# Patient Record
Sex: Male | Born: 1975 | Race: White | Hispanic: No | Marital: Married | Smoking: Never smoker
Health system: Southern US, Community
[De-identification: ages and names within clinical notes are randomized; demographics above are authoritative.]

## PROBLEM LIST (undated history)

## (undated) DIAGNOSIS — G902 Horner's syndrome: Secondary | ICD-10-CM

## (undated) HISTORY — PX: NOSE SURGERY: SHX723

## (undated) HISTORY — PX: HAND SURGERY: SHX662

## (undated) HISTORY — PX: RESECTION DISTAL CLAVICAL: SHX5053

## (undated) HISTORY — PX: OTHER SURGICAL HISTORY: SHX169

---

## 2000-01-22 ENCOUNTER — Encounter: Payer: Self-pay | Admitting: Emergency Medicine

## 2000-01-22 ENCOUNTER — Emergency Department (HOSPITAL_COMMUNITY): Admission: EM | Admit: 2000-01-22 | Discharge: 2000-01-22 | Payer: Self-pay | Admitting: Emergency Medicine

## 2000-04-12 ENCOUNTER — Ambulatory Visit (HOSPITAL_BASED_OUTPATIENT_CLINIC_OR_DEPARTMENT_OTHER): Admission: RE | Admit: 2000-04-12 | Discharge: 2000-04-12 | Payer: Self-pay | Admitting: Oral Surgery

## 2000-04-25 ENCOUNTER — Encounter: Payer: Self-pay | Admitting: Specialist

## 2000-04-25 ENCOUNTER — Ambulatory Visit (HOSPITAL_COMMUNITY): Admission: RE | Admit: 2000-04-25 | Discharge: 2000-04-25 | Payer: Self-pay | Admitting: Specialist

## 2000-05-07 ENCOUNTER — Encounter: Payer: Self-pay | Admitting: Emergency Medicine

## 2000-05-07 ENCOUNTER — Emergency Department (HOSPITAL_COMMUNITY): Admission: EM | Admit: 2000-05-07 | Discharge: 2000-05-08 | Payer: Self-pay | Admitting: Emergency Medicine

## 2000-05-30 ENCOUNTER — Encounter: Payer: Self-pay | Admitting: Oral Surgery

## 2000-05-30 ENCOUNTER — Ambulatory Visit (HOSPITAL_COMMUNITY): Admission: RE | Admit: 2000-05-30 | Discharge: 2000-05-30 | Payer: Self-pay | Admitting: Oral Surgery

## 2000-06-09 ENCOUNTER — Ambulatory Visit (HOSPITAL_BASED_OUTPATIENT_CLINIC_OR_DEPARTMENT_OTHER): Admission: RE | Admit: 2000-06-09 | Discharge: 2000-06-09 | Payer: Self-pay | Admitting: Oral Surgery

## 2008-11-18 ENCOUNTER — Emergency Department (HOSPITAL_COMMUNITY): Admission: EM | Admit: 2008-11-18 | Discharge: 2008-11-18 | Payer: Self-pay | Admitting: Emergency Medicine

## 2009-10-27 ENCOUNTER — Encounter (INDEPENDENT_AMBULATORY_CARE_PROVIDER_SITE_OTHER): Payer: Self-pay | Admitting: *Deleted

## 2009-10-29 ENCOUNTER — Encounter: Admission: RE | Admit: 2009-10-29 | Discharge: 2009-10-29 | Payer: Self-pay | Admitting: Gastroenterology

## 2009-10-29 ENCOUNTER — Encounter (INDEPENDENT_AMBULATORY_CARE_PROVIDER_SITE_OTHER): Payer: Self-pay | Admitting: *Deleted

## 2010-08-04 NOTE — Letter (Signed)
Summary: New Patient letter  [facility]  [address] [place], [place] [address]   Phone: [phone]  Fax: [phone]       10/27/2009 MRN: [id number]  Hunter Ball [address] [place], [place]  [address]  Dear Hunter Ball,  Welcome to the Gastroenterology Division at [facility].    You are scheduled to see Dr. Christella Hartigan on 11-19-09 at 2:30p.m. on the 3rd floor at [facility], [address]  We ask that you try to arrive at our office 15 minutes prior to your appointment time to allow for check-in.  We would like you to complete the enclosed self-administered evaluation form prior to your visit and bring it with you on the day of your appointment.  We will review it with you.  Also, please bring a complete list of all your medications or, if you prefer, bring the medication bottles and we will list them.  Please bring your insurance card so that we may make a copy of it.  If your insurance requires a referral to see a specialist, please bring your referral form from your primary care physician.  Co-payments are due at the time of your visit and may be paid by cash, check or credit card.     Your office visit will consist of a consult with your physician (includes a physical exam), any laboratory testing he/she may order, scheduling of any necessary diagnostic testing (e.g. x-ray, ultrasound, CT-scan), and scheduling of a procedure (e.g. Endoscopy, Colonoscopy) if required.  Please allow enough time on your schedule to allow for any/all of these possibilities.    If you cannot keep your appointment, please call [phone] to cancel or reschedule prior to your appointment date.  This allows [place] the opportunity to schedule an appointment for another patient in need of care.  If you do not cancel or reschedule by 5 p.m. the business day prior to your appointment date, you will be charged a $50.00 late cancellation/no-show fee.    Thank you for  choosing [facility] for your medical needs.  We appreciate the opportunity to care for you.  Please visit [place] at our website  to learn more about our practice.                     Sincerely,                                                             The Gastroenterology Division

## 2010-11-20 NOTE — Op Note (Signed)
[place]  Patient:    Hunter Ball, Hunter Ball                     MRN: [id number] Proc. Date: 04/12/00 Adm. Date:  [id number] Disc. 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  PROCEDURE:  Surgical rapid palatal expansion.  ANESTHESIA:  General.  SURGEON:  Hinton Dyer, D.D.S.  ESTIMATED BLOOD LOSS:  Approximately 200 cc.  CONDITION AT END OF SURGERY:  Good.  PROCEDURE:  Following preoperative medication, the patient was brought to the operating room and placed in the supine position, in which he remained throughout the whole procedure.  He was intubated by right nasal endotracheal tube and then prepped and draped in the usual fashion for an intraoral procedure.  He was then turned 90% to the anesthesia cart.  After draping the patient off, the mouth was irrigated and suctioned out, and a moist, open 4x4 gauze was placed around the endotracheal tube.  Xylocaine 2% with 1:100,000 epinephrine 4.5 cc was infiltrated in the mucobuccal fold on the right and left side, extending from the pterygoid plate region to the midline region.  Attention was then turned to the left side while the bite block was placed on the right side.  A #15 blade was used to make an incision at the height of the mucobuccal fold in the area of the first molar to the canine on the same side - that would be tooth #11.  An elevator and a Hannahan were used to reflect the soft tissues up approximately 1.5 cm.  The dissection was carried back to the pterygoid plate region, and carried forward to the anterior nasal aperture.  A Freer elevator was used to reflect the soft tissues of the nasal aperture off the bony wall. With a ______ periosteal in  place, a reciprocating saw was then used to make a cut from the pterygoid plate to the nasal aperture with a ______ periosteal protecting the soft tissues.  Attention was paid to stay 2-3 mm above the apices of the teeth.  The area was irrigated out and then closed primarily with multiple 4-0 Vicryl sutures.  Prior to doing this, the pterygoid plate osteotome was inserted against the pterygoid plate and tapped into position with the finger intraorally, to make sure that the pterygoid plate was free.  Attention was then turned to the right side, the bite block was switched, and a #15 blade was used to make an incision from the first molar to the right canine area - tooth #6, and a full thickness mucobuccal periosteal flap was then elevated.  Again, the dissection was carried back to the pterygoid plate area and to the lateral nasal aperture.  Again, the [facility] was used to reflect the soft tissues off the floor of the nose.  With a ______ periosteal in place, a reciprocating saw was used to make a cut from the pterygoid plate area to the nasal aperture.  The pterygoid plate osteotome was tapped into position on the right side, filling the leading edge and intraorally.  Once this was done, the area was irrigated out, and again, the cut was made 2-3 mm above the apices  of the maxillary teeth.  Attention was then turned to the soft tissues in the anterior region, and with a vertical incision in place down to bone, the periosteal elevator reflected the soft tissues laterally, and reflecting the soft tissues off the suture, and then a 7 mm crosscut fissure brewer was used to make a cut down the midline, staying between the teeth.  A spatula osteotome was then tapped through with finger pressure to make sure that it did not perforate the soft tissues of the palate, and the osteotome was tapped all the way down to the hub of the osteotome.  It was then removed, and then tapped through  the alveolus in a vertical direction.  Again, care was taken not to perforate the soft tissues.  Once done, the key was used to activate the appliance, and it was turned four times.  Once it was activated, mobility of both segments was visualized with the ______ forming in the anterior region.  The Hyrax appliance was then backed down the four turns and the centrals came back to touching again. The cuts were then irrigated out, and closed primarily with 4-0 Vicryl sutures in horizontal mattress fashion.  The throat pack was removed.  The patients mouth was irrigated and suctioned dry, and he was returned to the recovery room in good condition.  He will be discharged home with a prescription for antibiotic, Keflex 500 mg x 28 two STAT and one q.i.d., and Vicodin x 20 one or two q.4h. p.r.n. pain. He was also given a bottle of Peridex preoperatively. DD:  04/12/00 TD:  04/12/00 Job: [id number]
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
## 2012-10-04 ENCOUNTER — Ambulatory Visit (INDEPENDENT_AMBULATORY_CARE_PROVIDER_SITE_OTHER): Payer: BC Managed Care – PPO | Admitting: Internal Medicine

## 2012-10-04 ENCOUNTER — Encounter: Payer: Self-pay | Admitting: Internal Medicine

## 2012-10-04 VITALS — BP 112/64 | HR 81 | Temp 97.9°F | Ht 79.0 in | Wt 205.0 lb

## 2012-10-04 DIAGNOSIS — Z0000 Encounter for general adult medical examination without abnormal findings: Secondary | ICD-10-CM

## 2012-10-04 NOTE — Assessment & Plan Note (Signed)
Td < 4 years ago per pt Never had a cscope EKG today, nsr, incomplete RBBB? Pt is asx, no previous EKG Labs Discussed diet (info provided) and exercise

## 2012-10-04 NOTE — Patient Instructions (Signed)
Please come back at your earliest convenience for labs: CMP,CBC, TSH, FLP-- dx v70 ---- exercise 3 hours a week

## 2012-10-04 NOTE — Progress Notes (Signed)
  Subjective:    Patient ID: Hunter Ball, male    DOB: May 28, 1976, 37 y.o.   MRN: [id number]  HPI New pt, CPX  No past medical history on file.  Past Surgical History  Procedure Laterality Date  . No past surgeries     Family History  Problem Relation Age of Onset  . Colon cancer Neg Hx   . Prostate cancer Neg Hx   . CAD Neg Hx   . Diabetes Neg Hx   . Valvular heart disease Father   . Hypertension Neg Hx    History   Social History  . Marital Status: Married    Spouse Name: N/A    Number of Children: 1  . Years of Education: N/A   Occupational History  . 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 Smoking status: Never Smoker   . Smokeless tobacco: Never Used  . Alcohol Use: Yes     Comment: rarely   . Drug Use: Not on file     Comment: rarely marihuana   . Sexually Active: Not on file   Other Topics Concern  . Not on file   Social History Narrative   Diet: healthy for the last 2-3 years   Exercise: has a physical job, no regular exercise            Review of Systems Doing well. No recent chest pain, shortness of breath, cough or wheezing. No nausea, vomiting, diarrhea or blood in the stools. No dysuria or gross hematuria.     Objective:   Physical Exam General -- alert, well-developed, NAD .   Neck --no thyromegaly Lungs -- normal respiratory effort, no intercostal retractions, no accessory muscle use, and normal breath sounds.   Heart-- normal rate, regular rhythm, no murmur, and no gallop.   Abdomen--soft, non-tender, no distention, no masses, no HSM, no guarding, and no rigidity.   Extremities-- no pretibial edema bilaterally  Neurologic-- alert & oriented X3 and strength normal in all extremities. Psych-- Cognition and judgment appear intact. Alert and cooperative with normal attention span and concentration.  not anxious appearing and not depressed appearing.        Assessment & Plan:

## 2012-10-05 ENCOUNTER — Encounter: Payer: Self-pay | Admitting: Internal Medicine

## 2012-10-12 ENCOUNTER — Other Ambulatory Visit (INDEPENDENT_AMBULATORY_CARE_PROVIDER_SITE_OTHER): Payer: BC Managed Care – PPO

## 2012-10-12 DIAGNOSIS — Z0000 Encounter for general adult medical examination without abnormal findings: Secondary | ICD-10-CM

## 2012-10-12 LAB — COMPREHENSIVE METABOLIC PANEL
ALT: 13 U/L (ref 0–53)
AST: 15 U/L (ref 0–37)
Albumin: 4.5 g/dL (ref 3.5–5.2)
Alkaline Phosphatase: 54 U/L (ref 39–117)
BUN: 16 mg/dL (ref 6–23)
CO2: 28 mEq/L (ref 19–32)
Calcium: 9.6 mg/dL (ref 8.4–10.5)
Chloride: 101 mEq/L (ref 96–112)
Creatinine, Ser: 1.2 mg/dL (ref 0.4–1.5)
GFR: 72.21 mL/min (ref >60.00)
Glucose, Bld: 95 mg/dL (ref 70–99)
Potassium: 4 mEq/L (ref 3.5–5.1)
Sodium: 137 mEq/L (ref 135–145)
Total Bilirubin: 1.4 mg/dL — ABNORMAL HIGH (ref 0.3–1.2)
Total Protein: 8 g/dL (ref 6.0–8.3)

## 2012-10-12 LAB — CBC WITH DIFFERENTIAL/PLATELET
Basophils Absolute: 0 10*3/uL (ref 0.0–0.1)
Basophils Relative: 0.2 % (ref 0.0–3.0)
Eosinophils Absolute: 0.2 10*3/uL (ref 0.0–0.7)
Eosinophils Relative: 2.7 % (ref 0.0–5.0)
HCT: 43.7 % (ref 39.0–52.0)
Hemoglobin: 14.9 g/dL (ref 13.0–17.0)
Lymphocytes Relative: 14.4 % (ref 12.0–46.0)
Lymphs Abs: 1.2 10*3/uL (ref 0.7–4.0)
MCHC: 34 g/dL (ref 30.0–36.0)
MCV: 89.6 fl (ref 78.0–100.0)
Monocytes Absolute: 0.7 10*3/uL (ref 0.1–1.0)
Monocytes Relative: 8.1 % (ref 3.0–12.0)
Neutro Abs: 6.4 10*3/uL (ref 1.4–7.7)
Neutrophils Relative %: 74.6 % (ref 43.0–77.0)
Platelets: 261 10*3/uL (ref 150.0–400.0)
RBC: 4.88 Mil/uL (ref 4.22–5.81)
RDW: 12.9 % (ref 11.5–14.6)
WBC: 8.6 10*3/uL (ref 4.5–10.5)

## 2012-10-12 LAB — LIPID PANEL
Cholesterol: 156 mg/dL (ref 0–200)
HDL: 41.1 mg/dL (ref >39.00)
LDL Cholesterol: 104 mg/dL — ABNORMAL HIGH (ref 0–99)
Total CHOL/HDL Ratio: 4
Triglycerides: 54 mg/dL (ref 0.0–149.0)
VLDL: 10.8 mg/dL (ref 0.0–40.0)

## 2012-10-12 LAB — TSH: TSH: 1.05 u[IU]/mL (ref 0.35–5.50)

## 2012-10-16 ENCOUNTER — Encounter: Payer: Self-pay | Admitting: *Deleted

## 2012-12-28 ENCOUNTER — Ambulatory Visit (INDEPENDENT_AMBULATORY_CARE_PROVIDER_SITE_OTHER): Payer: BC Managed Care – PPO | Admitting: Family Medicine

## 2012-12-28 ENCOUNTER — Encounter: Payer: Self-pay | Admitting: Family Medicine

## 2012-12-28 VITALS — BP 110/62 | HR 62 | Temp 98.4°F | Wt 205.8 lb

## 2012-12-28 DIAGNOSIS — L247 Irritant contact dermatitis due to plants, except food: Secondary | ICD-10-CM

## 2012-12-28 DIAGNOSIS — L255 Unspecified contact dermatitis due to plants, except food: Secondary | ICD-10-CM

## 2012-12-28 MED ORDER — PREDNISONE 10 MG PO TABS: ORAL_TABLET | ORAL | Status: DC

## 2012-12-28 MED ORDER — MOMETASONE FUROATE 0.1 % EX CREA: TOPICAL_CREAM | Freq: Every day | CUTANEOUS | Status: DC

## 2012-12-28 NOTE — Patient Instructions (Signed)
Poison Ivy Poison ivy is a inflammation of the skin (contact dermatitis) caused by touching the allergens on the leaves of the ivy plant following previous exposure to the plant. The rash usually appears 48 hours after exposure. The rash is usually bumps (papules) or blisters (vesicles) in a linear pattern. Depending on your own sensitivity, the rash may simply cause redness and itching, or it may also progress to blisters which may break open. These must be well cared for to prevent secondary bacterial (germ) infection, followed by scarring. Keep any open areas dry, clean, dressed, and covered with an antibacterial ointment if needed. The eyes may also get puffy. The puffiness is worst in the morning and gets better as the day progresses. This dermatitis usually heals without scarring, within 2 to 3 weeks without treatment. HOME CARE INSTRUCTIONS  Thoroughly wash with soap and water as soon as you have been exposed to poison ivy. You have about one half hour to remove the plant resin before it will cause the rash. This washing will destroy the oil or antigen on the skin that is causing, or will cause, the rash. Be sure to wash under your fingernails as any plant resin there will continue to spread the rash. Do not rub skin vigorously when washing affected area. Poison ivy cannot spread if no oil from the plant remains on your body. A rash that has progressed to weeping sores will not spread the rash unless you have not washed thoroughly. It is also important to wash any clothes you have been wearing as these may carry active allergens. The rash will return if you wear the unwashed clothing, even several days later. Avoidance of the plant in the future is the best measure. Poison ivy plant can be recognized by the number of leaves. Generally, poison ivy has three leaves with flowering branches on a single stem. Diphenhydramine may be purchased over the counter and used as needed for itching. Do not drive with  this medication if it makes you drowsy.Ask your caregiver about medication for children. SEEK MEDICAL CARE IF:  Open sores develop.  Redness spreads beyond area of rash.  You notice purulent (pus-like) discharge.  You have increased pain.  Other signs of infection develop (such as fever). Document Released: 06/18/2000 Document Revised: 09/13/2011 Document Reviewed: 05/07/2009 ExitCare Patient Information 2014 [employer].  

## 2012-12-30 ENCOUNTER — Encounter: Payer: Self-pay | Admitting: Family Medicine

## 2012-12-30 NOTE — Progress Notes (Signed)
  Subjective:     Hunter Ball is a 37 y.o. male who presents for evaluation of a rash involving the lower extremity and upper extremity. Rash started several days ago. Lesions are pink, and blistering in texture. Rash has changed over time. Rash is pruritic.--but it is improving.   Associated symptoms: none. Patient denies: abdominal pain, arthralgia, congestion, cough, crankiness, decrease in appetite, decrease in energy level, fever, headache, irritability, myalgia, nausea, sore throat and vomiting. Patient has not had contacts with similar rash. Patient has had new exposures (soaps, lotions, laundry detergents, foods, medications, plants, insects or animals).  The following portions of the patient's history were reviewed and updated as appropriate: allergies, current medications, past family history, past medical history, past social history, past surgical history and problem list.  Review of Systems Pertinent items are noted in HPI.    Objective:    BP 110/62  Pulse 62  Temp(Src) 98.4 F (36.9 C) (Oral)  Wt 205 lb 12.8 oz (93.35 kg)  BMI 23.17 kg/m2  SpO2 98% General:  alert, cooperative, appears stated age and no distress  Skin:  vesicles noted on extremities     Assessment:    contact dermatitis: plants poison ivy    Plan:    Medications: steroids: pred taper and topical steroid: see meds and orders. verbal and written patient instruction given. Follow up in several days.

## 2013-04-18 ENCOUNTER — Ambulatory Visit: Payer: BC Managed Care – PPO | Admitting: Internal Medicine

## 2013-04-19 ENCOUNTER — Encounter: Payer: Self-pay | Admitting: Family Medicine

## 2013-04-19 ENCOUNTER — Ambulatory Visit (INDEPENDENT_AMBULATORY_CARE_PROVIDER_SITE_OTHER): Payer: Worker's Compensation | Admitting: Family Medicine

## 2013-04-19 VITALS — BP 104/62 | HR 80 | Temp 98.1°F | Wt 199.8 lb

## 2013-04-19 DIAGNOSIS — M25519 Pain in unspecified shoulder: Secondary | ICD-10-CM

## 2013-04-19 DIAGNOSIS — M25511 Pain in right shoulder: Secondary | ICD-10-CM

## 2013-04-19 NOTE — Progress Notes (Signed)
  Subjective:    Hunter Ball is a 37 y.o. male who presents with right shoulder pain. The symptoms began several months ago. 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 Pain is located between the neck and shoulder. Discomfort is described as numbness, sharp/stabbing, tingling and popping. Symptoms are exacerbated by repetitive movements and overhead movements. Evaluation to date: none. Therapy to date includes: nothing specific.  The following portions of the patient's history were reviewed and updated as appropriate: allergies, current medications, past family history, past medical history, past social history, past surgical history and problem list.  Review of Systems Pertinent items are noted in HPI.   Objective:    BP 104/62  Pulse 80  Temp(Src) 98.1 F (36.7 C) (Oral)  Wt 199 lb 12.8 oz (90.629 kg)  BMI 22.5 kg/m2  SpO2 98% Right shoulder: full ROM and crepitus with ROM  Left shoulder: normal active ROM, no tenderness, no impingement sign     Assessment:    Right shoulder pain    Plan:    Rest, ice, compression, and elevation (RICE) therapy. Orthopedics referral.

## 2013-04-19 NOTE — Patient Instructions (Signed)
Shoulder Pain  The shoulder is the joint that connects your arms to your body. The bones that form the shoulder joint include the upper arm bone (humerus), the shoulder blade (scapula), and the collarbone (clavicle). The top of the humerus is shaped like a ball and fits into a rather flat socket on the scapula (glenoid cavity). A combination of muscles and strong, fibrous tissues that connect muscles to bones (tendons) support your shoulder joint and hold the ball in the socket. Small, fluid-filled sacs (bursae) are located in different areas of the joint. They act as cushions between the bones and the overlying soft tissues and help reduce friction between the gliding tendons and the bone as you move your arm. Your shoulder joint allows a wide range of motion in your arm. This range of motion allows you to do things like scratch your back or throw a ball. However, this range of motion also makes your shoulder more prone to pain from overuse and injury.  Causes of shoulder pain can originate from both injury and overuse and usually can be grouped in the following four categories:   Redness, swelling, and pain (inflammation) of the tendon (tendinitis) or the bursae (bursitis).   Instability, such as a dislocation of the joint.   Inflammation of the joint (arthritis).   Broken bone (fracture).  HOME CARE INSTRUCTIONS    Apply ice to the sore area.   Put ice in a plastic bag.   Place a towel between your skin and the bag.   Leave the ice on for 15-20 minutes, 3-4 times per day for the first 2 days.   Stop using cold packs if they do not help with the pain.   If you have a shoulder sling or immobilizer, wear it as long as your caregiver instructs. Only remove it to shower or bathe. Move your arm as little as possible, but keep your hand moving to prevent swelling.   Squeeze a soft ball or foam pad as much as possible to help prevent swelling.   Only take over-the-counter or prescription medicines for pain,  discomfort, or fever as directed by your caregiver.  SEEK MEDICAL CARE IF:    Your shoulder pain increases, or new pain develops in your arm, hand, or fingers.   Your hand or fingers become cold and numb.   Your pain is not relieved with medicines.  SEEK IMMEDIATE MEDICAL CARE IF:    Your arm, hand, or fingers are numb or tingling.   Your arm, hand, or fingers are significantly swollen or turn white or blue.  MAKE SURE YOU:    Understand these instructions.   Will watch your condition.   Will get help right away if you are not doing well or get worse.  Document Released: 03/31/2005 Document Revised: 03/15/2012 Document Reviewed: 06/05/2011  ExitCare Patient Information 2014 [employer].

## 2013-07-28 ENCOUNTER — Encounter (HOSPITAL_COMMUNITY): Payer: Self-pay | Admitting: Emergency Medicine

## 2013-07-28 ENCOUNTER — Emergency Department (INDEPENDENT_AMBULATORY_CARE_PROVIDER_SITE_OTHER)
Admission: EM | Admit: 2013-07-28 | Discharge: 2013-07-28 | Disposition: A | Discharge status: Home / Self Care | Payer: BC Managed Care – PPO | Source: Home / Self Care | Attending: Emergency Medicine | Admitting: Emergency Medicine

## 2013-07-28 DIAGNOSIS — S99929A Unspecified injury of unspecified foot, initial encounter: Secondary | ICD-10-CM

## 2013-07-28 DIAGNOSIS — S99919A Unspecified injury of unspecified ankle, initial encounter: Secondary | ICD-10-CM

## 2013-07-28 DIAGNOSIS — S8010XA Contusion of unspecified lower leg, initial encounter: Secondary | ICD-10-CM

## 2013-07-28 DIAGNOSIS — S8990XA Unspecified injury of unspecified lower leg, initial encounter: Secondary | ICD-10-CM
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
## 2013-07-28 NOTE — ED Provider Notes (Signed)
CSN: [id number]     Arrival date & time 07/28/13  1440 History   First MD Initiated Contact with Patient 07/28/13 1511     Chief Complaint  Patient presents with  . Leg Injury   (Consider location/radiation/quality/duration/timing/severity/associated sxs/prior Treatment) HPI Comments: 38 year old male with no significant past medical history presents for evaluation of a leg injury sustained one week ago. He was walking up hill when he slipped and hit his shin on to something. He had a large hematoma formed over the shin within one hour which resolved over the next 4 days. However, this stopped getting better 4 days ago and has been consistent swelling on the anterior shin. There is also some mild to moderate tenderness over the anterior shin around the place where he hit. The pain is not getting worse, it just does not continue to resolve like it did at first. No history of DVT or PE. No swelling or numbness in his foot distal to this   History reviewed. No pertinent past medical history. Past Surgical History  Procedure Laterality Date  . Orthopedic surgeries     Family History  Problem Relation Age of Onset  . Colon cancer Neg Hx   . Prostate cancer Neg Hx   . CAD Neg Hx   . Diabetes Neg Hx   . Valvular heart disease Father   . Hypertension Neg Hx    History  Substance Use Topics  . Smoking status: Never Smoker   . Smokeless tobacco: Never Used  . Alcohol Use: Yes     Comment: occasional    Review of Systems  Constitutional: Negative for fever, chills and fatigue.  HENT: Negative for sore throat.   Eyes: Negative for visual disturbance.  Respiratory: Negative for cough and shortness of breath.   Cardiovascular: Negative for chest pain, palpitations and leg swelling.  Gastrointestinal: Negative for nausea, vomiting, abdominal pain, diarrhea and constipation.  Genitourinary: Negative for dysuria, urgency, frequency and hematuria.  Musculoskeletal:       See HPI  Skin:  Negative for rash.  Neurological: Negative for dizziness, weakness and light-headedness.    Allergies  Review of patient's allergies indicates no known allergies.  Home Medications  No current outpatient prescriptions on file. BP 114/68  Pulse 84  Temp(Src) 97.5 F (36.4 C) (Oral)  Resp 18  SpO2 100% Physical Exam  Nursing note and vitals reviewed. Constitutional: He is oriented to person, place, and time. He appears well-developed and well-nourished. No distress.  HENT:  Head: Normocephalic.  Pulmonary/Chest: Effort normal. No respiratory distress.  Musculoskeletal:       Legs: Neurological: He is alert and oriented to person, place, and time. Coordination normal.  Skin: Skin is warm and dry. No rash noted. He is not diaphoretic.  Psychiatric: He has a normal mood and affect. Judgment normal.    ED Course  Procedures (including critical care time) Labs Review Labs Reviewed - No data to display Imaging Review No results found.    MDM   1. Leg injury   2. Traumatic hematoma of lower leg    I believe this patient has a hematoma that has not yet fully resolved. There are no Signs of infection or DVT at this time. We have discussed strict return precautions in case of developing signs or symptoms of DVT or infection.    Graylon GoodZachary H Malaney Mcbean, PA-C 07/28/13 [id number]

## 2013-07-28 NOTE — ED Provider Notes (Signed)
Medical screening examination/treatment/procedure(s) were performed by non-physician practitioner and as supervising physician I was immediately available for consultation/collaboration.  Leslee Homeavid Katheryne Gorr, M.D.   Reuben Likesavid C Leonilda Cozby, MD 07/28/13 2135

## 2013-07-28 NOTE — Discharge Instructions (Signed)
Hematoma A hematoma is a collection of blood under the skin, in an organ, in a body space, in a joint space, or in other tissue. The blood can clot to form a lump that you can see and feel. The lump is often firm and may sometimes become sore and tender. Most hematomas get better in a few days to weeks. However, some hematomas may be serious and require medical care. Hematomas can range in size from very small to very large. CAUSES  A hematoma can be caused by a blunt or penetrating injury. It can also be caused by spontaneous leakage from a blood vessel under the skin. Spontaneous leakage from a blood vessel is more likely to occur in older people, especially those taking blood thinners. Sometimes, a hematoma can develop after certain medical procedures. SIGNS AND SYMPTOMS   A firm lump on the body.  Possible pain and tenderness in the area.  Bruising.Blue, dark blue, purple-red, or yellowish skin may appear at the site of the hematoma if the hematoma is close to the surface of the skin. For hematomas in deeper tissues or body spaces, the signs and symptoms may be subtle. For example, an intra-abdominal hematoma may cause abdominal pain, weakness, fainting, and shortness of breath. An intracranial hematoma may cause a headache or symptoms such as weakness, trouble speaking, or a change in consciousness. DIAGNOSIS  A hematoma can usually be diagnosed based on your medical history and a physical exam. Imaging tests may be needed if your health care provider suspects a hematoma in deeper tissues or body spaces, such as the abdomen, head, or chest. These tests may include ultrasonography or a CT scan.  TREATMENT  Hematomas usually go away on their own over time. Rarely does the blood need to be drained out of the body. Large hematomas or those that may affect vital organs will sometimes need surgical drainage or monitoring. HOME CARE INSTRUCTIONS   Apply ice to the injured area:   Put ice in a  plastic bag.   Place a towel between your skin and the bag.   Leave the ice on for 20 minutes, 2 3 times a day for the first 1 to 2 days.   After the first 2 days, switch to using warm compresses on the hematoma.   Elevate the injured area to help decrease pain and swelling. Wrapping the area with an elastic bandage may also be helpful. Compression helps to reduce swelling and promotes shrinking of the hematoma. Make sure the bandage is not wrapped too tight.   If your hematoma is on a lower extremity and is painful, crutches may be helpful for a couple days.   Only take over-the-counter or prescription medicines as directed by your health care provider. SEEK IMMEDIATE MEDICAL CARE IF:   You have increasing pain, or your pain is not controlled with medicine.   You have a fever.   You have worsening swelling or discoloration.   Your skin over the hematoma breaks or starts bleeding.   Your hematoma is in your chest or abdomen and you have weakness, shortness of breath, or a change in consciousness.  Your hematoma is on your scalp (caused by a fall or injury) and you have a worsening headache or a change in alertness or consciousness. MAKE SURE YOU:   Understand these instructions.  Will watch your condition.  Will get help right away if you are not doing well or get worse. Document Released: 02/03/2004 Document Revised: 02/21/2013 Document Reviewed:   11/29/2012 Exit[employer] Patient Information 2014 [employer].  

## 2013-08-01 ENCOUNTER — Encounter: Payer: Self-pay | Admitting: Family Medicine

## 2013-08-01 ENCOUNTER — Ambulatory Visit (HOSPITAL_BASED_OUTPATIENT_CLINIC_OR_DEPARTMENT_OTHER)
Admission: RE | Admit: 2013-08-01 | Discharge: 2013-08-01 | Disposition: A | Discharge status: Home / Self Care | Payer: BC Managed Care – PPO | Source: Ambulatory Visit | Attending: Family Medicine | Admitting: Family Medicine

## 2013-08-01 ENCOUNTER — Ambulatory Visit (INDEPENDENT_AMBULATORY_CARE_PROVIDER_SITE_OTHER): Payer: BC Managed Care – PPO | Admitting: Family Medicine

## 2013-08-01 VITALS — BP 110/74 | HR 71 | Temp 98.2°F | Resp 16 | Wt 218.0 lb

## 2013-08-01 DIAGNOSIS — S99919A Unspecified injury of unspecified ankle, initial encounter: Secondary | ICD-10-CM

## 2013-08-01 DIAGNOSIS — S8990XA Unspecified injury of unspecified lower leg, initial encounter: Secondary | ICD-10-CM

## 2013-08-01 DIAGNOSIS — X58XXXA Exposure to other specified factors, initial encounter: Secondary | ICD-10-CM | POA: Insufficient documentation

## 2013-08-01 DIAGNOSIS — M7989 Other specified soft tissue disorders: Secondary | ICD-10-CM | POA: Insufficient documentation

## 2013-08-01 DIAGNOSIS — M79609 Pain in unspecified limb: Secondary | ICD-10-CM | POA: Insufficient documentation

## 2013-08-01 DIAGNOSIS — L02419 Cutaneous abscess of limb, unspecified: Secondary | ICD-10-CM

## 2013-08-01 DIAGNOSIS — S99929A Unspecified injury of unspecified foot, initial encounter: Secondary | ICD-10-CM

## 2013-08-01 DIAGNOSIS — L03119 Cellulitis of unspecified part of limb: Secondary | ICD-10-CM | POA: Insufficient documentation

## 2013-08-01 MED ORDER — CEPHALEXIN 500 MG PO CAPS: 500.0000 mg | ORAL_CAPSULE | Freq: Two times a day (BID) | ORAL | Status: AC

## 2013-08-01 MED ORDER — TRAMADOL HCL 50 MG PO TABS: 50.0000 mg | ORAL_TABLET | Freq: Three times a day (TID) | ORAL | Status: DC | PRN

## 2013-08-01 NOTE — Assessment & Plan Note (Signed)
New.  Get xray to assess for possible fx.  Ibuprofen for pain/swelling.  Start tramadol for night pain.  Reviewed supportive care and red flags that should prompt return.  Pt expressed understanding and is in agreement w/ plan.

## 2013-08-01 NOTE — Assessment & Plan Note (Signed)
New.  Start abx given surrounding erythema and central scab.  Reviewed supportive care and red flags that should prompt return.  Pt expressed understanding and is in agreement w/ plan.

## 2013-08-01 NOTE — Progress Notes (Signed)
Pre visit review using our clinic review tool, if applicable. No additional management support is needed unless otherwise documented below in the visit note. 

## 2013-08-01 NOTE — Progress Notes (Signed)
   Subjective:    Patient ID: Hunter Ball, male    DOB: Sep 25, 1975, 38 y.o.   MRN: [id number]  HPI Leg pain- R anterior lower leg, fell on an incline and hit shin.  4-5 hrs later had 'softball sized' lump at site of injury.  Went to [facility] on Saturday b/c it remained golf ball sized, and was told it was a hematoma.  Most painful during 1 hr commute and using leg to step on gas.  Now having pain at night.      Review of Systems For ROS see HPI     Objective:   Physical Exam  Vitals reviewed. Constitutional: He appears well-developed and well-nourished. No distress.  Cardiovascular: Intact distal pulses.   Musculoskeletal:  R upper tibia w/ hematoma like swelling w/ central scab and surrounding erythema.  + TTP  Neurological: Coordination normal.  Skin: Skin is warm and dry. There is erythema.  Psychiatric: He has a normal mood and affect. His behavior is normal.          Assessment & Plan:

## 2013-08-01 NOTE — Patient Instructions (Signed)
Go to [facility] [address] and get your xrays Alternate tylenol and ibuprofen every 4 hrs for muscle inflammation and pain Use the Tramadol at night for pain Take the keflex twice daily x7 days for cellulitis (infection) Call with any questions or concerns Hang in there!!!

## 2014-07-08 ENCOUNTER — Encounter: Payer: Self-pay | Admitting: Medical

## 2014-07-08 ENCOUNTER — Ambulatory Visit (INDEPENDENT_AMBULATORY_CARE_PROVIDER_SITE_OTHER): Payer: Self-pay | Admitting: Medical

## 2014-07-08 VITALS — BP 119/72 | HR 82 | Temp 97.6°F | Ht 79.5 in | Wt 206.8 lb

## 2014-07-08 DIAGNOSIS — R1032 Left lower quadrant pain: Secondary | ICD-10-CM

## 2014-07-08 DIAGNOSIS — R197 Diarrhea, unspecified: Secondary | ICD-10-CM

## 2014-07-08 LAB — COMPLETE METABOLIC PANEL WITH GFR
ALT: 12 U/L (ref 0–53)
AST: 14 U/L (ref 0–37)
Albumin: 4.6 g/dL (ref 3.5–5.2)
Alkaline Phosphatase: 51 U/L (ref 39–117)
BUN: 15 mg/dL (ref 6–23)
CO2: 30 mEq/L (ref 19–32)
Calcium: 9.5 mg/dL (ref 8.4–10.5)
Chloride: 102 mEq/L (ref 96–112)
Creat: 1.02 mg/dL (ref 0.50–1.35)
GFR, Est African American: >89 mL/min
GFR, Est Non African American: >89 mL/min
Glucose, Bld: 77 mg/dL (ref 70–99)
Potassium: 4.4 mEq/L (ref 3.5–5.3)
Sodium: 138 mEq/L (ref 135–145)
Total Bilirubin: 1 mg/dL (ref 0.2–1.2)
Total Protein: 7.1 g/dL (ref 6.0–8.3)

## 2014-07-08 LAB — CBC WITH DIFFERENTIAL/PLATELET
Basophils Absolute: 0 10*3/uL (ref 0.0–0.1)
Basophils Relative: 0.5 % (ref 0.0–3.0)
Eosinophils Absolute: 0.3 10*3/uL (ref 0.0–0.7)
Eosinophils Relative: 4.6 % (ref 0.0–5.0)
HCT: 40.5 % (ref 39.0–52.0)
Hemoglobin: 13.6 g/dL (ref 13.0–17.0)
Lymphocytes Relative: 20.6 % (ref 12.0–46.0)
Lymphs Abs: 1.3 10*3/uL (ref 0.7–4.0)
MCHC: 33.7 g/dL (ref 30.0–36.0)
MCV: 89.5 fl (ref 78.0–100.0)
Monocytes Absolute: 0.8 10*3/uL (ref 0.1–1.0)
Monocytes Relative: 11.7 % (ref 3.0–12.0)
Neutro Abs: 4.1 10*3/uL (ref 1.4–7.7)
Neutrophils Relative %: 62.6 % (ref 43.0–77.0)
Platelets: 275 10*3/uL (ref 150.0–400.0)
RBC: 4.52 Mil/uL (ref 4.22–5.81)
RDW: 12.8 % (ref 11.5–15.5)
WBC: 6.5 10*3/uL (ref 4.0–10.5)

## 2014-07-08 MED ORDER — CIPROFLOXACIN HCL 500 MG PO TABS: 500.0000 mg | ORAL_TABLET | Freq: Two times a day (BID) | ORAL | Status: DC

## 2014-07-08 MED ORDER — METRONIDAZOLE 500 MG PO TABS: 500.0000 mg | ORAL_TABLET | Freq: Three times a day (TID) | ORAL | Status: DC

## 2014-07-08 NOTE — Assessment & Plan Note (Signed)
You have likely acute  viral gastroenteritis but possible(by your hx other chronic conditions). I want you to rest, hydrate, follow bland diet guidlines and take tylenol for fever.  Take imodium otc for loose stools.  There is some chance that your have a bacterial infection so I do want you to get stool panel kit and turn that in as soon as possible. Turning stool panel kit earlier will provide us with quicker result of studies and more informed decision if antibiotics are needed.  If you get increased left lower quadrant pain pending studies then you can start flagyl and cipro antibiotics. But would discourage that.  If you get any direct rt lower quadrant pain then would recommend going ahead and getting imaging studies.  Follow up 10-14 days or as needed.   

## 2014-07-08 NOTE — Progress Notes (Signed)
Pre visit review using our clinic review tool, if applicable. No additional management support is needed unless otherwise documented below in the visit note. 

## 2014-07-08 NOTE — Progress Notes (Signed)
   Subjective:    Patient ID: Hunter Ball, male    DOB: 10-Jan-1976, 39 y.o.   MRN: [id number]  HPI   Pt states abdominal pain. He states pain is his lower abdomen. Pt states on both sides. He feels some bloated sensation. He does not pass a lot of gas. This has been going on couple of weeks. Paint on both sides and about the same but mild and transient will last seconds at times.. Pt has occasional loose stools in the past recently passes loose stools with small chunks past 2 weeks. Last few weeks random urge to have bm. Most recenly has been going 4-5 times a day. Stomach rumbling. No family members sick past 2 weeks. NO fevers, no chils, no nauseu, no vomiting. No recent suspicious preceding foods before last 2 weeks. He can eat. No history of ibs, No hx of chrones or ulcerative colitis.   Pt does have remote hx of traveling to [place] and half at the longest. But would eat foods that he questions sanitary conditions. But around time he did not have any acute illness.   Review of Systems  Constitutional: Negative for fever, chills and fatigue.  Respiratory: Negative for cough, choking and wheezing.   Cardiovascular: Negative for chest pain and palpitations.  Gastrointestinal: Positive for abdominal pain and diarrhea. Negative for nausea, vomiting, constipation, blood in stool, abdominal distention, anal bleeding and rectal pain.  Genitourinary: Negative.   Musculoskeletal: Negative for back pain.  Skin: Negative for rash.  Neurological: Negative for dizziness, syncope, speech difficulty, weakness, light-headedness and numbness.  Hematological: Negative for adenopathy.  Psychiatric/Behavioral: Negative for confusion and sleep disturbance.       Objective:   Physical Exam   General Appearance- Not in acute distress.  HEENT Eyes- Scleraeral/Conjuntiva-bilat- Not Yellow. Mouth & Throat- Normal.  Chest and Lung Exam Auscultation: Breath  sounds:-Normal. Adventitious sounds:- No Adventitious sounds.  Cardiovascular Auscultation:Rythm - Regular. Heart Sounds -Normal heart sounds.  Abdomen Inspection:-Inspection Normal.  Palpation/Perucssion: Palpation and Percussion of the abdomen reveal- faint minimal tender left lower quadrant tenderness(no direct tenderness rt lower quadrant at all today), No Rebound tenderness, No rigidity(Guarding) and No Palpable abdominal masses.  Liver:-Normal.  Spleen:- Normal.   Back- no cva tenderness         Assessment & Plan:

## 2014-07-08 NOTE — Patient Instructions (Signed)
You have likely acute  viral gastroenteritis but possible(by your hx other chronic conditions). I want you to rest, hydrate, follow bland diet guidlines and take tylenol for fever.  Take imodium otc for loose stools.  There is some chance that your have a bacterial infection so I do want you to get stool panel kit and turn that in as soon as possible. Turning stool panel kit earlier will provide [place] with quicker result of studies and more informed decision if antibiotics are needed.  If you get increased left lower quadrant pain pending studies then you can start flagyl and cipro antibiotics. But would discourage that.  If you get any direct rt lower quadrant pain then would recommend going ahead and getting imaging studies.  Follow up 10-14 days or as needed.

## 2015-01-15 ENCOUNTER — Encounter (HOSPITAL_COMMUNITY): Payer: Self-pay | Admitting: Family Medicine

## 2015-01-15 ENCOUNTER — Emergency Department (HOSPITAL_COMMUNITY)
Admission: EM | Admit: 2015-01-15 | Discharge: 2015-01-15 | Disposition: A | Discharge status: Home / Self Care | Payer: BLUE CROSS/BLUE SHIELD | Attending: Emergency Medicine | Admitting: Emergency Medicine

## 2015-01-15 ENCOUNTER — Telehealth: Payer: Self-pay | Admitting: Internal Medicine

## 2015-01-15 ENCOUNTER — Emergency Department (HOSPITAL_COMMUNITY): Payer: BLUE CROSS/BLUE SHIELD

## 2015-01-15 DIAGNOSIS — R197 Diarrhea, unspecified: Secondary | ICD-10-CM | POA: Diagnosis not present

## 2015-01-15 DIAGNOSIS — R079 Chest pain, unspecified: Secondary | ICD-10-CM | POA: Diagnosis not present

## 2015-01-15 LAB — URINALYSIS, ROUTINE W REFLEX MICROSCOPIC
Bilirubin Urine: NEGATIVE
Glucose, UA: NEGATIVE mg/dL
Hgb urine dipstick: NEGATIVE
Ketones, ur: NEGATIVE mg/dL
Leukocytes, UA: NEGATIVE
Nitrite: NEGATIVE
Protein, ur: NEGATIVE mg/dL
Specific Gravity, Urine: 1.024 (ref 1.005–1.030)
Urobilinogen, UA: 0.2 mg/dL (ref 0.0–1.0)
pH: 7 (ref 5.0–8.0)

## 2015-01-15 LAB — BASIC METABOLIC PANEL
Anion gap: 8 (ref 5–15)
BUN: 16 mg/dL (ref 6–20)
CO2: 24 mmol/L (ref 22–32)
Calcium: 9.8 mg/dL (ref 8.9–10.3)
Chloride: 104 mmol/L (ref 101–111)
Creatinine, Ser: 1 mg/dL (ref 0.61–1.24)
GFR calc Af Amer: >60 mL/min (ref >60)
GFR calc non Af Amer: >60 mL/min (ref >60)
Glucose, Bld: 98 mg/dL (ref 65–99)
Potassium: 4.1 mmol/L (ref 3.5–5.1)
Sodium: 136 mmol/L (ref 135–145)

## 2015-01-15 LAB — CBC
HCT: 39.8 % (ref 39.0–52.0)
Hemoglobin: 14.1 g/dL (ref 13.0–17.0)
MCH: 30.7 pg (ref 26.0–34.0)
MCHC: 35.4 g/dL (ref 30.0–36.0)
MCV: 86.5 fL (ref 78.0–100.0)
Platelets: 228 10*3/uL (ref 150–400)
RBC: 4.6 MIL/uL (ref 4.22–5.81)
RDW: 12.6 % (ref 11.5–15.5)
WBC: 12.7 10*3/uL — ABNORMAL HIGH (ref 4.0–10.5)

## 2015-01-15 LAB — I-STAT TROPONIN, ED
Troponin i, poc: 0 ng/mL (ref 0.00–0.08)
Troponin i, poc: 0 ng/mL (ref 0.00–0.08)

## 2015-01-15 MED ORDER — DICYCLOMINE HCL 10 MG PO CAPS
10.0000 mg | ORAL_CAPSULE | Freq: Once | ORAL | Status: AC
  Administered 2015-01-15: 10 mg via ORAL
  Filled 2015-01-15: qty 1

## 2015-01-15 MED ORDER — ONDANSETRON HCL 4 MG/2ML IJ SOLN
4.0000 mg | Freq: Once | INTRAMUSCULAR | Status: AC
  Administered 2015-01-15: 4 mg via INTRAVENOUS
  Filled 2015-01-15: qty 2

## 2015-01-15 MED ORDER — SODIUM CHLORIDE 0.9 % IV BOLUS (SEPSIS)
1000.0000 mL | Freq: Once | INTRAVENOUS | Status: AC
  Administered 2015-01-15: 1000 mL via INTRAVENOUS

## 2015-01-15 MED ORDER — ONDANSETRON 4 MG PO TBDP: 4.0000 mg | ORAL_TABLET | Freq: Three times a day (TID) | ORAL | Status: DC | PRN

## 2015-01-15 MED ORDER — LOPERAMIDE HCL 2 MG PO CAPS
2.0000 mg | ORAL_CAPSULE | ORAL | Status: DC | PRN
  Administered 2015-01-15: 2 mg via ORAL
  Filled 2015-01-15: qty 1

## 2015-01-15 NOTE — Telephone Encounter (Signed)
Patient called in stating that he was having diarrhea and stomach pain. He also states that he had chest pain last week and hasn't felt the same ever since. I was on hold with team health and patient disconnected the call. Call patient back and asked him not to disconnect the call, that I am trying to get one of our nurses. Patient states that he will just go to the ED and disconnected the call.

## 2015-01-15 NOTE — ED Provider Notes (Signed)
CSN: [id number]     Arrival date & time 01/15/15  1338 History   First MD Initiated Contact with Patient 01/15/15 1644     Chief Complaint  Patient presents with  . Chest Pain     (Consider location/radiation/quality/duration/timing/severity/associated sxs/prior Treatment) Patient is a 39 y.o. male presenting with chest pain. The history is provided by the patient and medical records.  Chest Pain   This is a 39 y.o. M here with multiple complaints.  Patient states he began having some chest pain on Friday which he attributed to indigestion. He states he was in his left substernal region with some radiation to his upper chest.  He states he has chronic numbness/paresthesias of left arm due to shoulder/wrist injuries and repairs.  He denies SOB, palpitations, dizziness.  He states pain carried over into Saturday, resolved after smoking marijuana.  Remains without any chest pain currently.  No known cardiac hx.  Father has leaky mitral valve, no hx of CAD/MI.  States he did ok Sunday- Tuesday, however he ate some ice cream last night which he thinks was spoiled and gave him diarrhea.  States he has had approx 15 episodes of diarrhea in the past 24 hours.  Watery, non-bloody.  Denies abdominal pain.  Some nausea but no vomiting.  States he feels generally weak and dehydration.  Has been drinking lots of water PTA, however still feels weak.  No fever, chills.  States he does have some cold sweats when he has diarrhea.  No medications tried PTA.  History reviewed. No pertinent past medical history. Past Surgical History  Procedure Laterality Date  . Orthopedic surgeries     Family History  Problem Relation Age of Onset  . Colon cancer Neg Hx   . Prostate cancer Neg Hx   . CAD Neg Hx   . Diabetes Neg Hx   . Valvular heart disease Father   . Hypertension Neg Hx    History  Substance Use Topics  . Smoking status: Never Smoker   . Smokeless tobacco: Never Used  . Alcohol Use: Yes     Comment:  occasional    Review of Systems  Cardiovascular: Positive for chest pain.  Gastrointestinal: Positive for diarrhea.  All other systems reviewed and are negative.     Allergies  Review of patient's allergies indicates no known allergies.  Home Medications   Prior to Admission medications   Not on File   BP 130/76 mmHg  Pulse 97  Temp(Src) 99.5 F (37.5 C) (Oral)  Resp 14  SpO2 100%   Physical Exam  Constitutional: He is oriented to person, place, and time. He appears well-developed and well-nourished. No distress.  HENT:  Head: Normocephalic and atraumatic.  Mouth/Throat: Oropharynx is clear and moist.  Eyes: Conjunctivae and EOM are normal. Pupils are equal, round, and reactive to light.  Neck: Normal range of motion. Neck supple.  Cardiovascular: Normal rate, regular rhythm and normal heart sounds.   Pulmonary/Chest: Effort normal and breath sounds normal. No respiratory distress. He has no wheezes.  Abdominal: Soft. Bowel sounds are normal. There is no tenderness. There is no guarding.  Musculoskeletal: Normal range of motion. He exhibits no edema.  Neurological: He is alert and oriented to person, place, and time.  Skin: Skin is warm and dry. He is not diaphoretic.  Psychiatric: He has a normal mood and affect.  Nursing note and vitals reviewed.   ED Course  Procedures (including critical care time) Labs Review Labs Reviewed  CBC - Abnormal; Notable for the following:    WBC 12.7 (*)    All other components within normal limits  BASIC METABOLIC PANEL  URINALYSIS, ROUTINE W REFLEX MICROSCOPIC (NOT AT [facility])  Rosezena Sensor, ED  [facility] Sensor, ED    Imaging Review Dg Chest 2 View  01/15/2015   CLINICAL DATA:  Chest pain. Left arm numbness starting on Saturday. Numbness in the fingers intermittently.  EXAM: CHEST  2 VIEW  COMPARISON:  10/29/2009  FINDINGS: The left mid clavicle is slightly wider than the right, query prior healed fracture.  No significant  pulmonary abnormality observed. Cardiac and mediastinal margins appear normal. No pleural effusion.  IMPRESSION: 1. No acute thoracic findings. 2. Broad appearance of the left mid clavicle, query remote healed fracture.   Electronically Signed   By: Gaylyn Rong M.D.   On: 01/15/2015 14:10     EKG Interpretation   Date/Time:  Wednesday January 15 2015 13:47:01 EDT Ventricular Rate:  102 PR Interval:  124 QRS Duration: 94 QT Interval:  308 QTC Calculation: 401 R Axis:   89 Text Interpretation:  Sinus tachycardia Incomplete right bundle branch  block Borderline ECG Confirmed by ZAMMIT  MD, JOSEPH [id number]) on 01/15/2015  8:31:22 PM      MDM   Final diagnoses:  Chest pain, unspecified chest pain type  Diarrhea   39 year old male here with chest pain over the weekend which resolved after smoking marijuana. He now has diarrhea after eating some spoiled ice cream last night. He reports generalized weakness.  Chronic numbness in left arm from prior injuries.  Patient is afebrile, nontoxic. He has no current chest pain and vital signs are stable on room air.  Abdominal exam benign. EKG without acute ischemic changes.  Lab work reassuring-- trop x2 negative.  CXR is clear.  Patient was treated with IVF, imodium, bentyl, and zofran here in the ED with improvement of symptoms.  He remains CP free.  No further diarrhea here in the ED.  VS remain stable on RA, patient is PERC negative.  Given negative work-up here, low suspicion for ACS, PE, dissection, or other acute cardiac event at this time.  Patient d/c home with symptomatic care.  Discussed plan with patient, he/she acknowledged understanding and agreed with plan of care.  Return precautions given for new or worsening symptoms.  Garlon Hatchet, PA-C 01/15/15 2357  Bethann Berkshire, MD 01/16/15 [phone]

## 2015-01-15 NOTE — Discharge Instructions (Signed)
Take the prescribed medication as directed.  Make sure to drink plenty of fluids to stay hydrated. Follow-up with your primary care physician. Return to the ED for new or worsening symptoms.

## 2015-01-15 NOTE — Telephone Encounter (Signed)
Patient presented to [place] ED.  

## 2015-01-15 NOTE — ED Notes (Signed)
Pt here for chest pain that started Friday. sts the pain is worse and numbness in left arm. sts diarrhea and cold sweats.

## 2015-02-11 ENCOUNTER — Telehealth: Payer: Self-pay | Admitting: *Deleted

## 2015-02-11 NOTE — Telephone Encounter (Signed)
Unable to reach patient at time of Pre-Visit Call.  Left message for patient to return call when available.    

## 2015-02-12 ENCOUNTER — Encounter: Payer: BLUE CROSS/BLUE SHIELD | Admitting: Internal Medicine

## 2015-02-20 ENCOUNTER — Telehealth: Payer: Self-pay | Admitting: Internal Medicine

## 2015-02-20 NOTE — Telephone Encounter (Signed)
pre visit letter mailed 02/06/15 °

## 2015-02-24 ENCOUNTER — Telehealth: Payer: Self-pay | Admitting: *Deleted

## 2015-02-24 ENCOUNTER — Encounter: Payer: Self-pay | Admitting: *Deleted

## 2015-02-24 NOTE — Telephone Encounter (Signed)
Pre-Visit Call completed with patient and chart updated.   Pre-Visit Info documented in Specialty Comments under [employer].    

## 2015-02-26 ENCOUNTER — Encounter: Payer: BLUE CROSS/BLUE SHIELD | Admitting: Physician Assistant

## 2015-02-26 ENCOUNTER — Telehealth: Payer: Self-pay | Admitting: Physician Assistant

## 2015-02-27 NOTE — Telephone Encounter (Signed)
Pt was no show 02/26/15 7:30am for cpe appt, lm for pt to reschedule, should have been with Dr. Drue Novel and I'm not sure why he was scheduled with you as there are no notes, charge for no show?

## 2015-02-27 NOTE — Telephone Encounter (Signed)
Charge. 

## 2015-09-22 ENCOUNTER — Encounter: Payer: Self-pay | Admitting: Internal Medicine

## 2015-09-22 ENCOUNTER — Ambulatory Visit (INDEPENDENT_AMBULATORY_CARE_PROVIDER_SITE_OTHER): Payer: BLUE CROSS/BLUE SHIELD | Admitting: Internal Medicine

## 2015-09-22 VITALS — BP 112/74 | HR 75 | Temp 97.6°F | Ht 79.5 in | Wt 199.4 lb

## 2015-09-22 DIAGNOSIS — M542 Cervicalgia: Secondary | ICD-10-CM

## 2015-09-22 DIAGNOSIS — R1310 Dysphagia, unspecified: Secondary | ICD-10-CM

## 2015-09-22 NOTE — Progress Notes (Signed)
Pre visit review using our clinic review tool, if applicable. No additional management support is needed unless otherwise documented below in the visit note. 

## 2015-09-22 NOTE — Progress Notes (Signed)
   Subjective:    Patient ID: Hunter Ball, male    DOB: 1975/10/17, 40 y.o.   MRN: [id number]  DOS:  09/22/2015 Type of visit - description : Acute Interval history: In the last few years had few episodes where he feels something "get caught" at the left anterior neck, he is unable to move the neck or swallow unless he turns his head to the right and hyperflex  the neck. Last episode was a week ago, afterwards the area was a slightly tender but no swollen.    Review of Systems  (-) GERD symptoms. No neck injury No odynophagia per se  History reviewed. No pertinent past medical history.  Past Surgical History  Procedure Laterality Date  . Orthopedic surgeries      Social History   Social History  . Marital Status: Married    Spouse Name: N/A  . Number of Children: 1  . Years of Education: N/A   Occupational History  . 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 Smoking status: Never Smoker   . Smokeless tobacco: Never Used  . Alcohol Use: Yes     Comment: occasional  . Drug Use: No     Comment: rarely marihuana   . Sexual Activity: Not on file   Other Topics Concern  . Not on file   Social History Narrative   Diet: healthy for the last 2-3 years   Exercise: has a physical job, no regular exercise               Medication List       This list is accurate as of: 09/22/15  5:49 PM.  Always use your most recent med list.               amoxicillin 500 MG capsule  Commonly known as:  AMOXIL  Take 1 capsule by mouth 3 (three) times daily.           Objective:   Physical Exam BP 112/74 mmHg  Pulse 75  Temp(Src) 97.6 F (36.4 C) (Oral)  Ht 6' 7.5" (2.019 m)  Wt 199 lb 6 oz (90.436 kg)  BMI 22.19 kg/m2  SpO2 98% General:   Well developed, well nourished . NAD.  HEENT:  Normocephalic . Face symmetric, atraumatic. Throat: Symmetric, small tonsils, uvula midline. Neck: Normal to inspection, palpation. No  thyromegaly or unusual LADs.  Skin: Not pale. Not jaundice Neurologic:  alert & oriented X3.  Speech normal, gait appropriate for age and unassisted Psych--  Cognition and judgment appear intact.  Cooperative with normal attention span and concentration.  Behavior appropriate. No anxious or depressed appearing.      Assessment & Plan:   Neck pain with swallowing as described above:  exam essentially negative, refer to ENT for further evaluation of his symptoms.

## 2015-09-22 NOTE — Patient Instructions (Signed)
Will refer you to ENT  Call if severe pain or swelling   Schedule a physical at your convenience

## 2015-10-06 ENCOUNTER — Telehealth: Payer: Self-pay | Admitting: *Deleted

## 2015-10-06 NOTE — Telephone Encounter (Signed)
Unable to reach patient at time of pre-visit call. Left message for patient to return call when available.  

## 2015-10-07 ENCOUNTER — Encounter: Payer: BLUE CROSS/BLUE SHIELD | Admitting: Internal Medicine

## 2015-10-07 ENCOUNTER — Telehealth: Payer: Self-pay | Admitting: Internal Medicine

## 2015-10-08 ENCOUNTER — Encounter: Payer: Self-pay | Admitting: Internal Medicine

## 2015-10-08 NOTE — Telephone Encounter (Signed)
Pt was no show 10/07/15 1:30pm for cpe, 2nd no show for cpe and 1 cancellation for cpe, charge or no charge?

## 2015-10-08 NOTE — Telephone Encounter (Signed)
charge 

## 2015-10-08 NOTE — Telephone Encounter (Signed)
Marked to charge and mailing no show letter °

## 2015-10-30 ENCOUNTER — Other Ambulatory Visit: Payer: Self-pay | Admitting: Otolaryngology

## 2015-10-30 DIAGNOSIS — M542 Cervicalgia: Secondary | ICD-10-CM

## 2015-11-04 ENCOUNTER — Ambulatory Visit
Admission: RE | Admit: 2015-11-04 | Discharge: 2015-11-04 | Disposition: A | Discharge status: Home / Self Care | Payer: BLUE CROSS/BLUE SHIELD | Source: Ambulatory Visit | Attending: Otolaryngology | Admitting: Otolaryngology

## 2015-11-04 DIAGNOSIS — M542 Cervicalgia: Secondary | ICD-10-CM

## 2015-11-04 MED ORDER — IOPAMIDOL (ISOVUE-300) INJECTION 61%
75.0000 mL | Freq: Once | INTRAVENOUS | Status: AC | PRN
  Administered 2015-11-04: 75 mL via INTRAVENOUS

## 2016-03-01 ENCOUNTER — Telehealth: Payer: Self-pay | Admitting: Internal Medicine

## 2016-03-01 DIAGNOSIS — R1310 Dysphagia, unspecified: Secondary | ICD-10-CM

## 2016-03-01 NOTE — Telephone Encounter (Signed)
Pt's spouse called in to request a referral. She says that pt is having concerns with his throat. She says that pt was once [facility] but he declined. Spouse would like a call back to discuss if labs will need to be completed also.   Please advise.     Huntley DecSara [phone]- [phone]

## 2016-03-01 NOTE — Telephone Encounter (Signed)
Was seen by ENT for a catch with certain neck movements, CT was negative. Advise patient: if he continue with concerns recommend to see Dr. Jenne PaneBates who he saw previously. If he likes a second opinion then refer [facility]

## 2016-03-01 NOTE — Telephone Encounter (Signed)
Pt sent to ENT in 10/2015, OV scanned into chart. Please advise.

## 2016-03-02 NOTE — Telephone Encounter (Signed)
Referral placed.

## 2016-03-04 ENCOUNTER — Ambulatory Visit (HOSPITAL_BASED_OUTPATIENT_CLINIC_OR_DEPARTMENT_OTHER)
Admission: RE | Admit: 2016-03-04 | Discharge: 2016-03-04 | Disposition: A | Discharge status: Home / Self Care | Payer: BLUE CROSS/BLUE SHIELD | Source: Ambulatory Visit | Attending: Internal Medicine | Admitting: Internal Medicine

## 2016-03-04 ENCOUNTER — Encounter: Payer: Self-pay | Admitting: Internal Medicine

## 2016-03-04 ENCOUNTER — Ambulatory Visit (INDEPENDENT_AMBULATORY_CARE_PROVIDER_SITE_OTHER): Payer: BLUE CROSS/BLUE SHIELD | Admitting: Internal Medicine

## 2016-03-04 VITALS — BP 108/60 | HR 79 | Temp 98.1°F | Resp 12 | Ht 79.5 in | Wt 182.2 lb

## 2016-03-04 DIAGNOSIS — R634 Abnormal weight loss: Secondary | ICD-10-CM | POA: Diagnosis present

## 2016-03-04 LAB — URINALYSIS, ROUTINE W REFLEX MICROSCOPIC
Bilirubin Urine: NEGATIVE
Hgb urine dipstick: NEGATIVE
Ketones, ur: NEGATIVE
Leukocytes, UA: NEGATIVE
Nitrite: NEGATIVE
Specific Gravity, Urine: 1.02 (ref 1.000–1.030)
Total Protein, Urine: NEGATIVE
Urine Glucose: NEGATIVE
Urobilinogen, UA: 0.2 (ref 0.0–1.0)
pH: 7 (ref 5.0–8.0)

## 2016-03-04 LAB — CBC WITH DIFFERENTIAL/PLATELET
Basophils Absolute: 0 10*3/uL (ref 0.0–0.1)
Basophils Relative: 0.2 % (ref 0.0–3.0)
Eosinophils Absolute: 0.1 10*3/uL (ref 0.0–0.7)
Eosinophils Relative: 1.8 % (ref 0.0–5.0)
HCT: 41.3 % (ref 39.0–52.0)
Hemoglobin: 13.9 g/dL (ref 13.0–17.0)
Lymphocytes Relative: 14.1 % (ref 12.0–46.0)
Lymphs Abs: 1.1 10*3/uL (ref 0.7–4.0)
MCHC: 33.7 g/dL (ref 30.0–36.0)
MCV: 91 fl (ref 78.0–100.0)
Monocytes Absolute: 1.1 10*3/uL — ABNORMAL HIGH (ref 0.1–1.0)
Monocytes Relative: 13.8 % — ABNORMAL HIGH (ref 3.0–12.0)
Neutro Abs: 5.6 10*3/uL (ref 1.4–7.7)
Neutrophils Relative %: 70.1 % (ref 43.0–77.0)
Platelets: 222 10*3/uL (ref 150.0–400.0)
RBC: 4.54 Mil/uL (ref 4.22–5.81)
RDW: 12.8 % (ref 11.5–15.5)
WBC: 7.9 10*3/uL (ref 4.0–10.5)

## 2016-03-04 LAB — COMPREHENSIVE METABOLIC PANEL
ALT: 15 U/L (ref 0–53)
AST: 15 U/L (ref 0–37)
Albumin: 4.6 g/dL (ref 3.5–5.2)
Alkaline Phosphatase: 43 U/L (ref 39–117)
BUN: 19 mg/dL (ref 6–23)
CO2: 31 mEq/L (ref 19–32)
Calcium: 9.3 mg/dL (ref 8.4–10.5)
Chloride: 103 mEq/L (ref 96–112)
Creatinine, Ser: 0.96 mg/dL (ref 0.40–1.50)
GFR: 92.59 mL/min (ref >60.00)
Glucose, Bld: 102 mg/dL — ABNORMAL HIGH (ref 70–99)
Potassium: 5.5 mEq/L — ABNORMAL HIGH (ref 3.5–5.1)
Sodium: 138 mEq/L (ref 135–145)
Total Bilirubin: 0.9 mg/dL (ref 0.2–1.2)
Total Protein: 7.7 g/dL (ref 6.0–8.3)

## 2016-03-04 LAB — POCT GLUCOSE (DEVICE FOR HOME USE): Glucose Fasting, POC: 110 mg/dL — AB (ref 70–99)

## 2016-03-04 LAB — HIV ANTIBODY (ROUTINE TESTING W REFLEX): HIV 1&2 Ab, 4th Generation: NONREACTIVE

## 2016-03-04 LAB — TSH: TSH: 1.14 u[IU]/mL (ref 0.35–4.50)

## 2016-03-04 LAB — SEDIMENTATION RATE: Sed Rate: 8 mm/hr (ref 0–15)

## 2016-03-04 LAB — FOLATE: Folate: 8.2 ng/mL (ref >5.9)

## 2016-03-04 LAB — VITAMIN B12: Vitamin B-12: 273 pg/mL (ref 211–911)

## 2016-03-04 NOTE — Patient Instructions (Signed)
GO TO THE LAB : Get the blood work     GO TO THE FRONT DESK Schedule your next appointment for a  follow-up in one month    STOP BY THE FIRST FLOOR:  get the XR     

## 2016-03-04 NOTE — Progress Notes (Signed)
Pre visit review using our clinic review tool, if applicable. No additional management support is needed unless otherwise documented below in the visit note. 

## 2016-03-04 NOTE — Progress Notes (Signed)
Subjective:    Patient ID: Hunter Ball, male    DOB: 1975/12/18, 40 y.o.   MRN: [id number]  DOS:  03/04/2016 Type of visit - description : acute, Here with his wife Interval history: The patient reports weight loss for the last several months, gradually. His level of activity has not changed, he has always been active, appetite is normal and he is eating normal amounts of food or maybe even more. Has not taken any new medications, is not anxious, amount of stress is as usual. Last week got mild sore throat and has some chills along with LAD at the right side of the neck, he is already feeling better.  Wt Readings from Last 3 Encounters:  03/04/16 182 lb 4 oz (82.7 kg)  09/22/15 199 lb 6 oz (90.4 kg)  07/08/14 206 lb 12.8 oz (93.8 kg)     Review of Systems No fevers Denies chest pain, difficulty breathing. Admits to fatigue after exercise, previously with the same amount of exercise he felt fine. Occasional dizziness. No nausea, vomiting, diarrhea or blood in the stools. Admits to occasional epigastric pain after fasting like "hunger pain". No actual heartburn. No cough No depression No recent foreign trips No tremors. Not particularly thirsty or increased urination  History reviewed. No pertinent past medical history.  Past Surgical History:  Procedure Laterality Date  . orthopedic surgeries      Social History   Social History  . Marital status: Married    Spouse name: N/A  . Number of children: 1  . Years of education: N/A   Occupational History  . 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 Smoking status: Never Smoker  . Smokeless tobacco: Never Used  . Alcohol use Yes     Comment: occasional  . Drug use:     Types: Marijuana     Comment: Rarely  . Sexual activity: Not on file   Other Topics Concern  . Not on file   Social History Narrative   Diet: healthy for the last 2-3 years   Exercise: has a physical job, no  regular exercise               Medication List    as of 03/04/2016 11:59 PM   You have not been prescribed any medications.        Objective:   Physical Exam BP 108/60 (BP Location: Left Arm, Patient Position: Sitting, Cuff Size: Small)   Pulse 79   Temp 98.1 F (36.7 C) (Oral)   Resp 12   Ht 6' 7.5" (2.019 m)   Wt 182 lb 4 oz (82.7 kg)   SpO2 99%   BMI 20.27 kg/m   General:   Well developed, BMI 20, he looks healthy. . NAD.  Neck: No  thyromegaly . Has a soft, 2 cm, mobile, nontender LAD at the right side of the neck, states he developed the last week after he developed a sore throat. HEENT:  Normocephalic . Face symmetric, atraumatic Lungs:  CTA B Normal respiratory effort, no intercostal retractions, no accessory muscle use. Heart: RRR,  no murmur.  No pretibial edema bilaterally  Abdomen:  Not distended, soft, non-tender. No rebound or rigidity.  No organomegaly. Lymph nodes: Negative on the underarm or groin Skin: Exposed areas without rash. Not pale. Not jaundice Neurologic:  alert & oriented X3.  Speech normal, gait appropriate for age and unassisted Strength symmetric and appropriate for age.  Psych: Cognition  and judgment appear intact.  Cooperative with normal attention span and concentration.  Behavior appropriate. No anxious or depressed appearing.    Assessment & Plan:   Weight loss: 40 year old gentleman, with a BMI of 20 but healthy-appearing presents with documented weight loss. ROS + for fatigue, ill-defined upper abdominal discomfort when fasting. On exam has right neck LAD likely  related to a recent URI. He was seen by ENT with a catch in the neck few months ago, CT was negative, symptoms is still there. Blood sugar today: 110 fasting Plan: Chest x-ray, UA, CBC, CMP, TSH, sedimentation rate, celiac screening (+ FH) , HIV, folic acid, B12, testosterone. If workup negative, consider CT abdomen chest - GI referral. Also request a second  opinion in regards the catch in the neck: Refer to wait for his once the workup is completed. RTC one month

## 2016-03-05 ENCOUNTER — Encounter: Payer: Self-pay | Admitting: Gastroenterology

## 2016-03-05 LAB — TISSUE TRANSGLUTAMINASE, IGA: Tissue Transglutaminase Ab, IgA: 1 U/mL (ref <4)

## 2016-03-08 LAB — TESTOS,TOTAL,FREE AND SHBG (FEMALE)
Sex Hormone Binding Glob.: 45 nmol/L (ref 10–50)
Testosterone, Free: 41.3 pg/mL (ref 35.0–155.0)
Testosterone,Total,LC/MS/MS: 413 ng/dL (ref 250–1100)

## 2016-03-10 ENCOUNTER — Telehealth: Payer: Self-pay | Admitting: Internal Medicine

## 2016-03-10 NOTE — Telephone Encounter (Signed)
-----   Message from Dedra SkeensStephanie S Stevens sent at 03/10/2016 12:56 PM EDT ----- Regarding: RE: GI referral Appointment has been scheduled with Doug SouJessica Zehr, PA on 03/15/16 @ 9:00am. Please make pt aware of appointment and I have cancelled November appointment!!   ----- Message ----- From: Maia PettiesKristie S Ortiz Sent: 03/10/2016  12:30 PM To: Natasha BenceJennifer S Sebastian, Stephanie S Stevens Subject: FW: GI referral                                Hi Judeth CornfieldStephanie,  Is it possible to accomodate Dr. Leta JunglingPaz's request to move pt up? Thanks. ----- Message ----- From: Conrad BurlingtonKaylyn Canter, CMA Sent: 03/10/2016  10:57 AM To: Natasha BenceJennifer S Sebastian, Stephanie S Stevens, # Subject: GI referral                                    Dr. Drue NovelPaz requesting Pt be sooner than 05/19/2016 at 0930 regarding weight loss or see if he can be put on the wait list for sooner appt if anyone cancels.  Thanks! KDC

## 2016-03-10 NOTE — Telephone Encounter (Signed)
Called and notified pt and provided address

## 2016-03-15 ENCOUNTER — Encounter: Payer: Self-pay | Admitting: Gastroenterology

## 2016-03-15 ENCOUNTER — Ambulatory Visit (INDEPENDENT_AMBULATORY_CARE_PROVIDER_SITE_OTHER): Payer: BLUE CROSS/BLUE SHIELD | Admitting: Gastroenterology

## 2016-03-15 ENCOUNTER — Encounter (INDEPENDENT_AMBULATORY_CARE_PROVIDER_SITE_OTHER): Payer: Self-pay

## 2016-03-15 VITALS — BP 98/54 | HR 66 | Ht 79.0 in | Wt 184.0 lb

## 2016-03-15 DIAGNOSIS — R634 Abnormal weight loss: Secondary | ICD-10-CM | POA: Diagnosis not present

## 2016-03-15 DIAGNOSIS — M542 Cervicalgia: Secondary | ICD-10-CM | POA: Diagnosis not present

## 2016-03-15 NOTE — Progress Notes (Addendum)
03/15/2016 Hunter Ball [id number] 04/06/1976   HISTORY OF PRESENT ILLNESS:  This is a 40 year old male who is new to our office and was referred here by his PCP, Dr. Drue Novel, for evaluation of weight loss. He tells me that he's had unintentional weight loss of about 30 pounds over the past 6 months. That has been the main reason for referral. He says that he has a great appetite and has not been eating any different or performing any different physical activities to what he would normally do. While he is here he also talks about having this clicking sensation in his neck when he swallows. He says it feels like there is a tendon or something that is getting caught near his "Adam's apple". He saw ENT, Dr. Jenne Pane, and had a CT scan of the neck, which was unremarkable. He says that sometimes it actually does get caught and he has to move and stretch his head the opposite direction to get it to relieve itself. He denies any actual pain with eating/swallowing or difficulty getting food down/getting food stuck. Even this clicking issue is not painful, just a nuisance.  He does report to me that once about every 6 months or so he gets an episode of severe left-sided abdominal cramping followed by diarrhea. This lasts for approximately one day and then resolves. He denies any abdominal pain or diarrhea between these episodes. Says he moves his bowels regularly otherwise without blood.  CBC, CMP, sed rate, TSH all WNL's (except for mildly elevated potassium level).   No past medical history on file. Past Surgical History:  Procedure Laterality Date  . HAND SURGERY    . NOSE SURGERY    . orthopedic surgeries    . RESECTION DISTAL CLAVICAL      reports that he has never smoked. He has never used smokeless tobacco. He reports that he drinks alcohol. He reports that he uses drugs, including Marijuana. family history includes Valvular heart disease in his father. No Known Allergies    No outpatient  encounter prescriptions on file as of 03/15/2016.   No facility-administered encounter medications on file as of 03/15/2016.      REVIEW OF SYSTEMS  : All other systems reviewed and negative except where noted in the History of Present Illness.   PHYSICAL EXAM: BP (!) 98/54   Pulse 66   Ht 6\' 7"  (2.007 m)   Wt 184 lb (83.5 kg)   BMI 20.73 kg/m  General: Well developed white male in no acute distress; tall and thin Head: Normocephalic and atraumatic Eyes:  Sclerae anicteric,conjunctive pink. Ears: Normal auditory acuity Neck: Supple, no masses.  No abnormalities felt but I could hear a "clicking" sound when he swallowed. Lungs: Clear throughout to auscultation Heart: Regular rate and rhythm Abdomen: Soft, non-distended.  Normal bowel sounds.  Non-tender. Musculoskeletal: Symmetrical with no gross deformities  Skin: No lesions on visible extremities Extremities: No edema  Neurological: Alert oriented x 4, grossly non-focal Psychological:  Alert and cooperative. Normal mood and affect  ASSESSMENT AND PLAN: -40 year old male with 30 pound unintentional weight loss over the past 6 months.  He also complains of a weird sensation in his neck when he swallows, but it does not sound esophageal. He already saw ENT with negative CT scan of his neck. He would like EGD for evaluation, although I feel that this is going to be negative, we will schedule with Dr. [name]. We are going to schedule CT  scan abdomen and pelvis with contrast as well.  To evaluate the weight loss. -Left sided abdominal pain and diarrhea:  Episodes occur about once every 6 months and lasts a day or so. No abdominal pain and normal bowel movements in between episodes. No symptoms currently.  *The risks, benefits, and alternatives to EGD were discussed with the patient and he consenta to proceed.      CC:  Wanda PlumpPaz, Jose E, MD

## 2016-03-15 NOTE — Patient Instructions (Signed)
If you are age 39 or older, your body mass index should be between 23-30. Your Body mass index is 20.73 kg/m. If this is out of the aforementioned range listed, please consider follow up with your Primary Care Provider.  If you are age 86 or younger, your body mass index should be between 19-25. Your Body mass index is 20.73 kg/m. If this is out of the aformentioned range listed, please consider follow up with your Primary Care Provider.   You have been scheduled for a CT scan of the abdomen and pelvis at [facility] ([address]---this is in the same building as Press [facility]).   You are scheduled on 03-19-2016 at [place] should arrive 15 minutes prior to your appointment time for registration. Please follow the written instructions below on the day of your exam:  WARNING: IF YOU ARE ALLERGIC TO IODINE/X-RAY DYE, PLEASE NOTIFY RADIOLOGY IMMEDIATELY AT [phone]! YOU WILL BE GIVEN A 13 HOUR PREMEDICATION PREP.  1) Do not eat or drink anything after 6am (4 hours prior to your test) 2) You have been given 2 bottles of oral contrast to drink. The solution may taste               better if refrigerated, but do NOT add ice or any other liquid to this solution. Shake             well before drinking.    Drink 1 bottle of contrast @ 8am (2 hours prior to your exam)  Drink 1 bottle of contrast @ 9am (1 hour prior to your exam)  You may take any medications as prescribed with a small amount of water except for the following: Metformin, Glucophage, Glucovance, Avandamet, Riomet, Fortamet, Actoplus Met, Janumet, Glumetza or Metaglip. The above medications must be held the day of the exam AND 48 hours after the exam.  The purpose of you drinking the oral contrast is to aid in the visualization of your intestinal tract. The contrast solution may cause some diarrhea. Before your exam is started, you will be given a small amount of fluid to drink. Depending on your individual set of  symptoms, you may also receive an intravenous injection of x-ray contrast/dye. Plan on being at [facility] for 30 minutes or longer, depending on the type of exam you are having performed.  This test typically takes 30-45 minutes to complete.  If you have any questions regarding your exam or if you need to reschedule, you may call the CT department at [phone] between the hours of 8:00 am and 5:00 pm, Monday-Friday.  ________________________________________________________________________  Dennis Bast have been scheduled for an endoscopy. Please follow written instructions given to you at your visit today. If you use inhalers (even only as needed), please bring them with you on the day of your procedure. Your physician has requested that you go to www.startemmi.com and enter the access code given to you at your visit today. This web site gives a general overview about your procedure. However, you should still follow specific instructions given to you by our office regarding your preparation for the procedure.  Thank you for choosing [facility]  Alonza Bogus, PA

## 2016-03-15 NOTE — Progress Notes (Signed)
Agree with initial assessment and plans. Looks like weight loss is closer to 15 pounds rather than 30. In any event,  EGD and CT reasonable to evaluate this issue. Not sure what to make of "clicking sensation".

## 2016-03-16 ENCOUNTER — Encounter: Payer: Self-pay | Admitting: Internal Medicine

## 2016-03-16 ENCOUNTER — Ambulatory Visit (AMBULATORY_SURGERY_CENTER): Payer: BLUE CROSS/BLUE SHIELD | Admitting: Internal Medicine

## 2016-03-16 VITALS — BP 108/82 | HR 80 | Temp 98.4°F | Resp 20 | Ht 79.0 in | Wt 184.0 lb

## 2016-03-16 DIAGNOSIS — R634 Abnormal weight loss: Secondary | ICD-10-CM

## 2016-03-16 MED ORDER — SODIUM CHLORIDE 0.9 % IV SOLN: 500.0000 mL | INTRAVENOUS | Status: DC

## 2016-03-16 NOTE — Progress Notes (Signed)
Report to PACU, RN, vss, BBS= Clear.  

## 2016-03-16 NOTE — Op Note (Signed)
[facility] Patient Name: Hunter Ball Procedure Date: 03/16/2016 9:04 AM MRN: [id number] Endoscopist: Hunter Ball. Hunter Ball , MD Age: 40 Referring MD:  Date of Birth: June 25, 1976 Gender: Male Account #: [id number] Procedure:                Upper GI endoscopy Indications:              Weight loss Medicines:                Monitored Anesthesia Care Procedure:                Pre-Anesthesia Assessment:                           - Prior to the procedure, a History and Physical                            was performed, and patient medications and                            allergies were reviewed. The patient's tolerance of                            previous anesthesia was also reviewed. The risks                            and benefits of the procedure and the sedation                            options and risks were discussed with the patient.                            All questions were answered, and informed consent                            was obtained. Prior Anticoagulants: The patient has                            taken no previous anticoagulant or antiplatelet                            agents. ASA Grade Assessment: II - A patient with                            mild systemic disease. After reviewing the risks                            and benefits, the patient was deemed in                            satisfactory condition to undergo the procedure.                           After obtaining informed consent, the endoscope was  passed under direct vision. Throughout the                            procedure, the patient's blood pressure, pulse, and                            oxygen saturations were monitored continuously. The                            Model GIF-[id number]) scope was introduced                            through the mouth, and advanced to the second part                            of duodenum. The upper GI endoscopy was                            accomplished without difficulty. The patient                            tolerated the procedure well. Scope In: Scope Out: Findings:                 The esophagus was normal.                           The stomach was normal.                           The examined duodenum was normal.                           The cardia and gastric fundus were normal on                            retroflexion. Complications:            No immediate complications. Estimated Blood Loss:     Estimated blood loss: none. Impression:               - Normal esophagus.                           - Normal stomach.                           - Normal examined duodenum.                           - No specimens collected. Recommendation:           - Patient has a contact number available for                            emergencies. The signs and symptoms of potential  delayed complications were discussed with the                            patient. Return to normal activities tomorrow.                            Written discharge instructions were provided to the                            patient.                           - Resume previous diet.                           - Continue present medications.-                           - Keep plans for CT scan. Will contact you with                            results. Hunter BonitoJohn N. Hunter GoodellPerry, MD 03/16/2016 9:21:35 AM This report has been signed electronically.

## 2016-03-16 NOTE — Patient Instructions (Signed)
YOU HAD AN ENDOSCOPIC PROCEDURE TODAY AT THE [facility]:   Refer to the procedure report that was given to you for any specific questions about what was found during the examination.  If the procedure report does not answer your questions, please call your gastroenterologist to clarify.  If you requested that your care partner not be given the details of your procedure findings, then the procedure report has been included in a sealed envelope for you to review at your convenience later.  YOU SHOULD EXPECT: Some feelings of bloating in the abdomen. Passage of more gas than usual.  Walking can help get rid of the air that was put into your GI tract during the procedure and reduce the bloating.   Please Note:  You might notice some irritation and congestion in your nose or some drainage.  This is from the oxygen used during your procedure.  There is no need for concern and it should clear up in a day or so.  SYMPTOMS TO REPORT IMMEDIATELY:    Following upper endoscopy (EGD)  Vomiting of blood or coffee ground material  New chest pain or pain under the shoulder blades  Painful or persistently difficult swallowing  New shortness of breath  Fever of 100F or higher  Black, tarry-looking stools  For urgent or emergent issues, a gastroenterologist can be reached at any hour by calling [phone].   DIET:  We do recommend a small meal at first, but then you may proceed to your regular diet.  Drink plenty of fluids but you should avoid alcoholic beverages for 24 hours.  ACTIVITY:  You should plan to take it easy for the rest of today and you should NOT DRIVE or use heavy machinery until tomorrow (because of the sedation medicines used during the test).    FOLLOW UP: Our staff will call the number listed on your records the next business day following your procedure to check on you and address any questions or concerns that you may have regarding the information given to you  following your procedure. If we do not reach you, we will leave a message.  However, if you are feeling well and you are not experiencing any problems, there is no need to return our call.  We will assume that you have returned to your regular daily activities without incident.  If any biopsies were taken you will be contacted by phone or by letter within the next 1-3 weeks.  Please call us at [phone] if you have not heard about the biopsies in 3 weeks.    SIGNATURES/CONFIDENTIALITY: You and/or your care partner have signed paperwork which will be entered into your electronic medical record.  These signatures attest to the fact that that the information above on your After Visit Summary has been reviewed and is understood.  Full responsibility of the confidentiality of this discharge information lies with you and/or your care-partner.  Keep your CT appointment.  Thank-you for choosing us for your healthcare needs today.

## 2016-03-17 ENCOUNTER — Telehealth: Payer: Self-pay

## 2016-03-17 NOTE — Telephone Encounter (Signed)
  Follow up Call-  Call back number 03/16/2016  Post procedure Call Back phone  # [phone]  Permission to leave phone message Yes  Some recent data might be hidden     Patient questions:  Do you have a fever, pain , or abdominal swelling? No. Pain Score  0 *  Have you tolerated food without any problems? Yes.    Have you been able to return to your normal activities? Yes.    Do you have any questions about your discharge instructions: Diet   No. Medications  No. Follow up visit  No.  Do you have questions or concerns about your Care? No.  Actions: * If pain score is 4 or above: No action needed, pain <4.

## 2016-03-19 ENCOUNTER — Ambulatory Visit (INDEPENDENT_AMBULATORY_CARE_PROVIDER_SITE_OTHER)
Admission: RE | Admit: 2016-03-19 | Discharge: 2016-03-19 | Disposition: A | Discharge status: Home / Self Care | Payer: BLUE CROSS/BLUE SHIELD | Source: Ambulatory Visit | Attending: Gastroenterology | Admitting: Gastroenterology

## 2016-03-19 DIAGNOSIS — M542 Cervicalgia: Secondary | ICD-10-CM | POA: Diagnosis not present

## 2016-03-19 DIAGNOSIS — R634 Abnormal weight loss: Secondary | ICD-10-CM | POA: Diagnosis not present

## 2016-03-19 MED ORDER — IOPAMIDOL (ISOVUE-300) INJECTION 61%
100.0000 mL | Freq: Once | INTRAVENOUS | Status: AC | PRN
  Administered 2016-03-19: 100 mL via INTRAVENOUS

## 2016-04-02 ENCOUNTER — Encounter: Payer: Self-pay | Admitting: Internal Medicine

## 2016-04-02 ENCOUNTER — Ambulatory Visit (INDEPENDENT_AMBULATORY_CARE_PROVIDER_SITE_OTHER): Payer: BLUE CROSS/BLUE SHIELD | Admitting: Internal Medicine

## 2016-04-02 VITALS — BP 118/68 | HR 69 | Temp 97.9°F | Resp 14 | Ht 79.0 in | Wt 183.2 lb

## 2016-04-02 DIAGNOSIS — R634 Abnormal weight loss: Secondary | ICD-10-CM | POA: Diagnosis not present

## 2016-04-02 DIAGNOSIS — Z09 Encounter for follow-up examination after completed treatment for conditions other than malignant neoplasm: Secondary | ICD-10-CM | POA: Insufficient documentation

## 2016-04-02 LAB — BASIC METABOLIC PANEL
BUN: 16 mg/dL (ref 6–23)
CO2: 33 mEq/L — ABNORMAL HIGH (ref 19–32)
Calcium: 9.3 mg/dL (ref 8.4–10.5)
Chloride: 103 mEq/L (ref 96–112)
Creatinine, Ser: 0.98 mg/dL (ref 0.40–1.50)
GFR: 90.38 mL/min (ref >60.00)
Glucose, Bld: 87 mg/dL (ref 70–99)
Potassium: 4.7 mEq/L (ref 3.5–5.1)
Sodium: 139 mEq/L (ref 135–145)

## 2016-04-02 LAB — CBC WITH DIFFERENTIAL/PLATELET
Basophils Absolute: 0 10*3/uL (ref 0.0–0.1)
Basophils Relative: 0.5 % (ref 0.0–3.0)
Eosinophils Absolute: 0.5 10*3/uL (ref 0.0–0.7)
Eosinophils Relative: 8.2 % — ABNORMAL HIGH (ref 0.0–5.0)
HCT: 40.9 % (ref 39.0–52.0)
Hemoglobin: 13.9 g/dL (ref 13.0–17.0)
Lymphocytes Relative: 26 % (ref 12.0–46.0)
Lymphs Abs: 1.6 10*3/uL (ref 0.7–4.0)
MCHC: 34 g/dL (ref 30.0–36.0)
MCV: 90.1 fl (ref 78.0–100.0)
Monocytes Absolute: 0.8 10*3/uL (ref 0.1–1.0)
Monocytes Relative: 12.6 % — ABNORMAL HIGH (ref 3.0–12.0)
Neutro Abs: 3.2 10*3/uL (ref 1.4–7.7)
Neutrophils Relative %: 52.7 % (ref 43.0–77.0)
Platelets: 238 10*3/uL (ref 150.0–400.0)
RBC: 4.55 Mil/uL (ref 4.22–5.81)
RDW: 13.6 % (ref 11.5–15.5)
WBC: 6.2 10*3/uL (ref 4.0–10.5)

## 2016-04-02 NOTE — Progress Notes (Signed)
Pre visit review using our clinic review tool, if applicable. No additional management support is needed unless otherwise documented below in the visit note. 

## 2016-04-02 NOTE — Patient Instructions (Signed)
GO TO THE LAB : Get the blood work     GO TO THE FRONT DESK Schedule your next appointment for a checkup in 4-6 months  

## 2016-04-02 NOTE — Assessment & Plan Note (Signed)
Weight loss: Workup included labs, CT abdomen, cxr,  and EGD unremarkable except for a potassium of 5.5. ROS (-) except for some sweats at night and decreased stamina. He continue with a "click in the neck", [facility]. Etiology weight loss unclear. Since the potassium was a slightly elevated will recheck a BMP, will also redo CBC and a blood smear due to night sweats. Blood dyscrasia?. Finally, will refer to Dr. Elvera LennoxGherghe, endocrinology to see if she suggest any further evaluation particularly with a potassium of 5.5. RTC 4-5 months.

## 2016-04-02 NOTE — Progress Notes (Signed)
Subjective:    Patient ID: Hunter BookmanMichael A Ball, male    DOB: [birth year], 40 y.o.   MRN: [id number]  DOS:  04/02/2016 Type of visit - description : f/u Interval history: Since the last office visit he has lost 1 pound, feels about the same. Again reports that his stamina is not the same as before but no dyspnea on exertion or chest pain. Denies fever but he sweats more than usual at night.  Wt Readings from Last 3 Encounters:  04/02/16 183 lb 4 oz (83.1 kg)  03/16/16 184 lb (83.5 kg)  03/15/16 184 lb (83.5 kg)     Review of Systems  Denies anxiety and depression, stress is actually less than few years ago. Appetite is normal. Continue with "catch" in the neck.  History reviewed. No pertinent past medical history.  Past Surgical History:  Procedure Laterality Date  . HAND SURGERY    . NOSE SURGERY    . orthopedic surgeries    . RESECTION DISTAL CLAVICAL      Social History   Social History  . Marital status: Married    Spouse name: N/A  . Number of children: 1  . Years of education: N/A   Occupational History  . 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 Smoking status: Never Smoker  . Smokeless tobacco: Never Used  . Alcohol use Yes     Comment: occasional  . Drug use:     Types: Marijuana     Comment: Rarely  . Sexual activity: Not on file   Other Topics Concern  . Not on file   Social History Narrative   Diet: healthy for the last 2-3 years   Exercise: has a physical job, no regular exercise               Medication List    as of 04/02/2016  9:49 AM   You have not been prescribed any medications.        Objective:   Physical Exam BP 118/68 (BP Location: Left Arm, Patient Position: Sitting, Cuff Size: Normal)   Pulse 69   Temp 97.9 F (36.6 C) (Oral)   Resp 14   Ht 6\' 7"  (2.007 m)   Wt 183 lb 4 oz (83.1 kg)   SpO2 97%   BMI 20.64 kg/m  General:   Well developed, well nourished . NAD.  HEENT:    Normocephalic . Face symmetric, atraumatic Neck: No masses Lymphatic system: No LADs at the axillae or groin area. Lungs:  CTA B Normal respiratory effort, no intercostal retractions, no accessory muscle use. Heart: RRR,  no murmur.  No pretibial edema bilaterally  Skin: Not pale. Not jaundice Neurologic:  alert & oriented X3.  Speech normal, gait appropriate for age and unassisted Psych--  Cognition and judgment appear intact.  Cooperative with normal attention span and concentration.  Behavior appropriate. No anxious or depressed appearing.      Assessment & Plan:  Assessment Weight loss  PLAN: Weight loss: Workup included labs, CT abdomen, cxr,  and EGD unremarkable except for a potassium of 5.5. ROS (-) except for some sweats at night and decreased stamina. He continue with a "click in the neck", [facility]. Etiology weight loss unclear. Since the potassium was a slightly elevated will recheck a BMP, will also redo CBC and a blood smear due to night sweats. Blood dyscrasia?. Finally, will refer to Dr. Elvera LennoxGherghe,  endocrinology to see if she suggest any further evaluation particularly with a potassium of 5.5. RTC 4-5 months.

## 2016-04-05 LAB — PATHOLOGIST SMEAR REVIEW

## 2016-05-19 ENCOUNTER — Ambulatory Visit: Payer: Self-pay | Admitting: Gastroenterology

## 2016-08-03 ENCOUNTER — Ambulatory Visit: Payer: Self-pay | Admitting: Internal Medicine

## 2016-09-30 ENCOUNTER — Emergency Department (HOSPITAL_BASED_OUTPATIENT_CLINIC_OR_DEPARTMENT_OTHER): Payer: Self-pay

## 2016-09-30 ENCOUNTER — Emergency Department (HOSPITAL_BASED_OUTPATIENT_CLINIC_OR_DEPARTMENT_OTHER)
Admission: EM | Admit: 2016-09-30 | Discharge: 2016-09-30 | Disposition: A | Discharge status: Home / Self Care | Payer: Self-pay | Attending: Emergency Medicine | Admitting: Emergency Medicine

## 2016-09-30 ENCOUNTER — Encounter (HOSPITAL_BASED_OUTPATIENT_CLINIC_OR_DEPARTMENT_OTHER): Payer: Self-pay | Admitting: Emergency Medicine

## 2016-09-30 DIAGNOSIS — R51 Headache: Secondary | ICD-10-CM | POA: Insufficient documentation

## 2016-09-30 DIAGNOSIS — R519 Headache, unspecified: Secondary | ICD-10-CM

## 2016-09-30 DIAGNOSIS — G902 Horner's syndrome: Secondary | ICD-10-CM

## 2016-09-30 LAB — CBC WITH DIFFERENTIAL/PLATELET
Basophils Absolute: 0 10*3/uL (ref 0.0–0.1)
Basophils Relative: 0 %
Eosinophils Absolute: 0.3 10*3/uL (ref 0.0–0.7)
Eosinophils Relative: 4 %
HCT: 38.2 % — ABNORMAL LOW (ref 39.0–52.0)
Hemoglobin: 13.3 g/dL (ref 13.0–17.0)
Lymphocytes Relative: 27 %
Lymphs Abs: 2 10*3/uL (ref 0.7–4.0)
MCH: 31.1 pg (ref 26.0–34.0)
MCHC: 34.8 g/dL (ref 30.0–36.0)
MCV: 89.3 fL (ref 78.0–100.0)
Monocytes Absolute: 1.1 10*3/uL — ABNORMAL HIGH (ref 0.1–1.0)
Monocytes Relative: 14 %
Neutro Abs: 4.2 10*3/uL (ref 1.7–7.7)
Neutrophils Relative %: 55 %
Platelets: 291 10*3/uL (ref 150–400)
RBC: 4.28 MIL/uL (ref 4.22–5.81)
RDW: 12.6 % (ref 11.5–15.5)
WBC: 7.7 10*3/uL (ref 4.0–10.5)

## 2016-09-30 LAB — BASIC METABOLIC PANEL
Anion gap: 8 (ref 5–15)
BUN: 18 mg/dL (ref 6–20)
CO2: 26 mmol/L (ref 22–32)
Calcium: 9.1 mg/dL (ref 8.9–10.3)
Chloride: 104 mmol/L (ref 101–111)
Creatinine, Ser: 1.06 mg/dL (ref 0.61–1.24)
GFR calc Af Amer: >60 mL/min (ref >60)
GFR calc non Af Amer: >60 mL/min (ref >60)
Glucose, Bld: 92 mg/dL (ref 65–99)
Potassium: 3.6 mmol/L (ref 3.5–5.1)
Sodium: 138 mmol/L (ref 135–145)

## 2016-09-30 MED ORDER — DIPHENHYDRAMINE HCL 50 MG/ML IJ SOLN
50.0000 mg | Freq: Once | INTRAMUSCULAR | Status: AC
  Administered 2016-09-30: 50 mg via INTRAVENOUS
  Filled 2016-09-30: qty 1

## 2016-09-30 MED ORDER — KETOROLAC TROMETHAMINE 30 MG/ML IJ SOLN
30.0000 mg | Freq: Once | INTRAMUSCULAR | Status: AC
  Administered 2016-09-30: 30 mg via INTRAVENOUS
  Filled 2016-09-30: qty 1

## 2016-09-30 MED ORDER — METOCLOPRAMIDE HCL 5 MG/ML IJ SOLN
10.0000 mg | Freq: Once | INTRAMUSCULAR | Status: AC
  Administered 2016-09-30: 10 mg via INTRAVENOUS
  Filled 2016-09-30: qty 2

## 2016-09-30 MED ORDER — METOCLOPRAMIDE HCL 10 MG PO TABS: 10.0000 mg | ORAL_TABLET | Freq: Two times a day (BID) | ORAL | 0 refills | Status: DC | PRN

## 2016-09-30 NOTE — ED Triage Notes (Signed)
Ha x 2 weeks.  Pt states he has been sick with the flu and having ha and eye pains

## 2016-09-30 NOTE — ED Provider Notes (Signed)
[facility]   CSN: [id number] Arrival date & time: 09/30/16  0047     History   Chief Complaint Chief Complaint  Patient presents with  . Headache    HPI Hunter Ball is a 41 y.o. male no sig PMH here with headache.  Patient states this has been going on for the past 11 days and is severe, behind his L eye.  He normally does not get headaches.  This was preceded by a viral illness which ran around his family. He also had vomiting and diarrhea. All the symptoms have since resolved but he continues to have the headache. He has no history of headaches. He's tried over-the-counter medications without any relief. He denies any neurological symptoms. He states his headaches are worse in the morning. There are no further complaints.  10 Systems reviewed and are negative for acute change except as noted in the HPI.   HPI  History reviewed. No pertinent past medical history.  Patient Active Problem List   Diagnosis Date Noted  . PCP NOTES >>>>>>>>> 04/02/2016  . Loss of weight 03/15/2016  . Neck pain 03/15/2016  . Annual physical exam 10/04/2012    Past Surgical History:  Procedure Laterality Date  . HAND SURGERY    . NOSE SURGERY    . orthopedic surgeries    . RESECTION DISTAL CLAVICAL         Home Medications    Prior to Admission medications   Medication Sig Start Date End Date Taking? Authorizing Provider  metoCLOPramide (REGLAN) 10 MG tablet Take 1 tablet (10 mg total) by mouth 2 (two) times daily as needed (headache). 09/30/16   Tomasita Crumble, MD    Family History Family History  Problem Relation Age of Onset  . Valvular heart disease Father   . Colon cancer Neg Hx   . Prostate cancer Neg Hx   . CAD Neg Hx   . Diabetes Neg Hx   . Hypertension Neg Hx   . Thyroid disease Neg Hx     Social History Social History  Substance Use Topics  . Smoking status: Never Smoker  . Smokeless tobacco: Never Used  . Alcohol use Yes   Comment: occasional     Allergies   Patient has no known allergies.   Review of Systems Review of Systems   Physical Exam Updated Vital Signs BP (!) 117/91   Pulse 86   Temp 98.2 F (36.8 C) (Oral)   Resp 18   Ht 6\' 7"  (2.007 m)   Wt 200 lb (90.7 kg)   SpO2 99%   BMI 22.53 kg/m   Physical Exam  Constitutional: He is oriented to person, place, and time. Vital signs are normal. He appears well-developed and well-nourished.  Non-toxic appearance. He does not appear ill. No distress.  HENT:  Head: Normocephalic and atraumatic.  Nose: Nose normal.  Mouth/Throat: Oropharynx is clear and moist. No oropharyngeal exudate.  Eyes: Conjunctivae and EOM are normal. Pupils are equal, round, and reactive to light. No scleral icterus.  Neck: Normal range of motion. Neck supple. No tracheal deviation, no edema, no erythema and normal range of motion present. No thyroid mass and no thyromegaly present.  Cardiovascular: Normal rate, regular rhythm, S1 normal, S2 normal, normal heart sounds, intact distal pulses and normal pulses.  Exam reveals no gallop and no friction rub.   No murmur heard. Pulmonary/Chest: Effort normal and breath sounds normal. No respiratory distress. He has no wheezes. He has  no rhonchi. He has no rales.  Abdominal: Soft. Normal appearance and bowel sounds are normal. He exhibits no distension, no ascites and no mass. There is no hepatosplenomegaly. There is no tenderness. There is no rebound, no guarding and no CVA tenderness.  Musculoskeletal: Normal range of motion. He exhibits no edema or tenderness.  Lymphadenopathy:    He has no cervical adenopathy.  Neurological: He is alert and oriented to person, place, and time. He has normal strength. No cranial nerve deficit or sensory deficit.  Normal strength and sensation in all his chemistries. Normal cerebellar testing. Normal gait.  Skin: Skin is warm, dry and intact. No petechiae and no rash noted. He is not  diaphoretic. No erythema. No pallor.  Nursing note and vitals reviewed.    ED Treatments / Results  Labs (all labs ordered are listed, but only abnormal results are displayed) Labs Reviewed  CBC WITH DIFFERENTIAL/PLATELET - Abnormal; Notable for the following:       Result Value   HCT 38.2 (*)    Monocytes Absolute 1.1 (*)    All other components within normal limits  BASIC METABOLIC PANEL    EKG  EKG Interpretation None       Radiology Ct Head Wo Contrast  Result Date: 09/30/2016 CLINICAL DATA:  Severe headache, worse in the morning. Right eye pain. Concern for intracranial mass. EXAM: CT HEAD WITHOUT CONTRAST TECHNIQUE: Contiguous axial images were obtained from the base of the skull through the vertex without intravenous contrast. COMPARISON:  None. FINDINGS: Brain: No evidence of acute infarction, hemorrhage, hydrocephalus, extra-axial collection or mass lesion/mass effect. Vascular: No hyperdense vessel or unexpected calcification. Skull: Normal. Negative for fracture or focal lesion. Sinuses/Orbits: Scattered mucosal thickening of the ethmoid air cells. Remaining paranasal sinuses are clear. Mastoid air cells are well-aerated. The orbits are unremarkable. Other: None. IMPRESSION: 1. No acute intracranial abnormality. No evidence of intracranial mass. 2. Minimal mucosal thickening of the ethmoid air cells. Electronically Signed   By: Rubye OaksMelanie  Ehinger M.D.   On: 09/30/2016 02:56    Procedures Procedures (including critical care time)  Medications Ordered in ED Medications  diphenhydrAMINE (BENADRYL) injection 50 mg (50 mg Intravenous Given 09/30/16 0218)  ketorolac (TORADOL) 30 MG/ML injection 30 mg (30 mg Intravenous Given 09/30/16 0219)  metoCLOPramide (REGLAN) injection 10 mg (10 mg Intravenous Given 09/30/16 0219)     Initial Impression / Assessment and Plan / ED Course  I have reviewed the triage vital signs and the nursing notes.  Pertinent labs & imaging results  that were available during my care of the patient were reviewed by me and considered in my medical decision making (see chart for details).     Patient presents to the emergency department for severe headache. Will get a CT scan of the head to evaluate for any masses headaches are worse in the morning. He was given Toradol, Reglan, Benadryl for symptoms. We'll continue to closely monitor.   3:57 AM Upon repeat evaluation, patient states he feels much better.  CT neg for mass.  Plan to Dc with reglan to take as needed. PCP fu within 3 days. He appears well and in NAD.  Neuro exam remains normal. Patient safe for DC. Final Clinical Impressions(s) / ED Diagnoses   Final diagnoses:  Bad headache    New Prescriptions New Prescriptions   METOCLOPRAMIDE (REGLAN) 10 MG TABLET    Take 1 tablet (10 mg total) by mouth 2 (two) times daily as needed (headache).  Tomasita Crumble, MD 09/30/16 [phone]

## 2016-10-12 ENCOUNTER — Ambulatory Visit: Payer: Self-pay | Admitting: Family Medicine

## 2016-10-15 ENCOUNTER — Ambulatory Visit (HOSPITAL_BASED_OUTPATIENT_CLINIC_OR_DEPARTMENT_OTHER): Admission: RE | Admit: 2016-10-15 | Payer: Self-pay | Source: Ambulatory Visit

## 2016-10-15 ENCOUNTER — Ambulatory Visit: Payer: Self-pay | Admitting: Internal Medicine

## 2016-10-15 ENCOUNTER — Emergency Department (HOSPITAL_COMMUNITY)
Admission: EM | Admit: 2016-10-15 | Discharge: 2016-10-15 | Disposition: A | Discharge status: Home / Self Care | Payer: Self-pay | Attending: Emergency Medicine | Admitting: Emergency Medicine

## 2016-10-15 ENCOUNTER — Emergency Department (HOSPITAL_COMMUNITY): Payer: Self-pay

## 2016-10-15 ENCOUNTER — Encounter (HOSPITAL_COMMUNITY): Payer: Self-pay | Admitting: *Deleted

## 2016-10-15 ENCOUNTER — Telehealth: Payer: Self-pay | Admitting: Internal Medicine

## 2016-10-15 DIAGNOSIS — H5712 Ocular pain, left eye: Secondary | ICD-10-CM

## 2016-10-15 DIAGNOSIS — H539 Unspecified visual disturbance: Secondary | ICD-10-CM

## 2016-10-15 DIAGNOSIS — G902 Horner's syndrome: Secondary | ICD-10-CM | POA: Insufficient documentation

## 2016-10-15 DIAGNOSIS — R930 Abnormal findings on diagnostic imaging of skull and head, not elsewhere classified: Secondary | ICD-10-CM | POA: Insufficient documentation

## 2016-10-15 LAB — I-STAT CHEM 8, ED
BUN: 18 mg/dL (ref 6–20)
Calcium, Ion: 1.17 mmol/L (ref 1.15–1.40)
Chloride: 100 mmol/L — ABNORMAL LOW (ref 101–111)
Creatinine, Ser: 1 mg/dL (ref 0.61–1.24)
Glucose, Bld: 143 mg/dL — ABNORMAL HIGH (ref 65–99)
HCT: 37 % — ABNORMAL LOW (ref 39.0–52.0)
Hemoglobin: 12.6 g/dL — ABNORMAL LOW (ref 13.0–17.0)
Potassium: 3.5 mmol/L (ref 3.5–5.1)
Sodium: 141 mmol/L (ref 135–145)
TCO2: 28 mmol/L (ref 0–100)

## 2016-10-15 MED ORDER — GADOBENATE DIMEGLUMINE 529 MG/ML IV SOLN
19.0000 mL | Freq: Once | INTRAVENOUS | Status: AC | PRN
  Administered 2016-10-15: 19 mL via INTRAVENOUS

## 2016-10-15 MED ORDER — GADOBENATE DIMEGLUMINE 529 MG/ML IV SOLN: 20.0000 mL | Freq: Once | INTRAVENOUS | Status: DC | PRN

## 2016-10-15 NOTE — ED Triage Notes (Signed)
Sent from clinic for MRA, Dr Jacqulyn Bath to see pt

## 2016-10-15 NOTE — Telephone Encounter (Signed)
Pt came 13 min late for appt today. Pt informed will need to resched. Pt states he drove an hour to get here and this is a recurring issue. Pt states he will find new pcp.

## 2016-10-15 NOTE — ED Provider Notes (Signed)
Emergency Department Provider Note   I have reviewed the triage vital signs and the nursing notes.   HISTORY  Chief Complaint Eye Pain   HPI Hunter Ball is a 41 y.o. male patient presents to the ED from [facility] with a painful Horner's syndrome. Thr patient had a GI illness 4 weeks prior with several episodes of forceful vomiting and diarrhea. During this episode he developed a severe pain behind the left eye and temple. THe pain persisted and he presented to the ED. He had labs and CT and was discharged home with f/u plan. He saw the eye doctor in clinic today and was referred to the ED for a painful horner's syndrome and concern for carotid artery dissection. Pain is moderate, non-radiation, with no modifying factors.   History reviewed. No pertinent past medical history.  Patient Active Problem List   Diagnosis Date Noted  . PCP NOTES >>>>>>>>> 04/02/2016  . Loss of weight 03/15/2016  . Neck pain 03/15/2016  . Annual physical exam 10/04/2012    Past Surgical History:  Procedure Laterality Date  . HAND SURGERY    . NOSE SURGERY    . orthopedic surgeries    . RESECTION DISTAL CLAVICAL      Current Outpatient Rx  . Order #: [id number] Class: Historical Med  . Order #: [id number] Class: Print    Allergies Patient has no known allergies.  Family History  Problem Relation Age of Onset  . Valvular heart disease Father   . Colon cancer Neg Hx   . Prostate cancer Neg Hx   . CAD Neg Hx   . Diabetes Neg Hx   . Hypertension Neg Hx   . Thyroid disease Neg Hx     Social History Social History  Substance Use Topics  . Smoking status: Never Smoker  . Smokeless tobacco: Never Used  . Alcohol use Yes     Comment: occasional    Review of Systems  Constitutional: No fever/chills Eyes: No visual changes. Positive pain behind left eye.  ENT: No sore throat. Cardiovascular: Denies chest pain. Respiratory: Denies shortness of  breath. Gastrointestinal: No abdominal pain.  No nausea, no vomiting.  No diarrhea.  No constipation. Genitourinary: Negative for dysuria. Musculoskeletal: Negative for back pain. Skin: Negative for rash. Neurological: Negative for focal weakness or numbness. Positive HA.   10-point ROS otherwise negative.  ____________________________________________   PHYSICAL EXAM:  VITAL SIGNS: ED Triage Vitals  Enc Vitals Group     BP 10/15/16 1809 127/74     Pulse Rate 10/15/16 1809 82     Resp 10/15/16 1809 16     Temp 10/15/16 1809 98.1 F (36.7 C)     Temp Source 10/15/16 1809 Oral     SpO2 10/15/16 1809 97 %     Weight 10/15/16 1810 200 lb (90.7 kg)     Height 10/15/16 1810  (2.007 m)   Constitutional: Alert and oriented. Well appearing and in no acute distress. Eyes: Conjunctivae are normal. L pupil 5 mm, R pupil 3 mm.  Head: Atraumatic. Nose: No congestion/rhinnorhea. Mouth/Throat: Mucous membranes are moist.  Oropharynx non-erythematous. Neck: No stridor. Cardiovascular: Normal rate, regular rhythm. Good peripheral circulation. Grossly normal heart sounds.   Respiratory: Normal respiratory effort.  No retractions. Lungs CTAB. Gastrointestinal: Soft and nontender. No distention.  Musculoskeletal: No lower extremity tenderness nor edema. No gross deformities of extremities. Neurologic:  Normal speech and language. No gross focal neurologic deficits are appreciated.Slight left face/eyelid droop appreciated.  Skin:  Skin is warm, dry and intact. No rash noted.   ____________________________________________   LABS (all labs ordered are listed, but only abnormal results are displayed)  Labs Reviewed  I-STAT CHEM 8, ED - Abnormal; Notable for the following:       Result Value   Chloride 100 (*)    Glucose, Bld 143 (*)    Hemoglobin 12.6 (*)    HCT 37.0 (*)    All other components within normal limits   ____________________________________________  RADIOLOGY  Mr  Maxine Glenn Head Wo Contrast  Result Date: 10/15/2016 CLINICAL DATA:  41 y/o M; Horner syndrome and visual field cut of the left eye. Severe pain behind the left eye. EXAM: MRA NECK WITHOUT AND WITH CONTRAST MRA HEAD WITHOUT CONTRAST TECHNIQUE: Multiplanar and multiecho pulse sequences of the neck were obtained without and with intravenous contrast. Angiographic images of the neck were obtained using MRA technique without and with intravenous contast.; Angiographic images of the Circle of Willis were obtained using MRA technique without intravenous contrast. CONTRAST:  1 MULTIHANCE GADOBENATE DIMEGLUMINE 529 MG/ML IV SOLN, 19mL MULTIHANCE GADOBENATE DIMEGLUMINE 529 MG/ML IV SOLN COMPARISON:  09/30/2016 CT head FINDINGS: MRA NECK FINDINGS Aortic arch: Patent. Right common carotid artery: Patent. Right internal carotid artery: Patent. Right vertebral artery: Patent. Left common carotid artery: Patent. Left Internal carotid artery: Patent. Left Vertebral artery: Patent. There is no evidence of high-grade stenosis, dissection, or aneurysm unless noted above. MRA HEAD FINDINGS Internal carotid arteries:  Patent. Anterior cerebral arteries:  Patent. Middle cerebral arteries: Patent.  Fenestrated left M1 segment. Anterior communicating artery: Patent. Posterior communicating arteries: Patent right. No left identified, likely hypoplastic or absent. Posterior cerebral arteries: Patent. Small right P1 segment with large right posterior communicating artery compatible with persistent fetal circulation. Basilar artery:  Patent. Vertebral arteries:  Patent. No evidence of high-grade stenosis, large vessel occlusion, or aneurysm unless noted above. IMPRESSION: 1. Normal MRA of the neck with and without intravenous contrast. 2. Normal MRA of the head without intravenous contrast. Electronically Signed   By: Mitzi Hansen M.D.   On: 10/15/2016 21:01   Mr Angiogram Neck W Or Wo Contrast  Result Date: 10/15/2016 CLINICAL  DATA:  41 y/o M; Horner syndrome and visual field cut of the left eye. Severe pain behind the left eye. EXAM: MRA NECK WITHOUT AND WITH CONTRAST MRA HEAD WITHOUT CONTRAST TECHNIQUE: Multiplanar and multiecho pulse sequences of the neck were obtained without and with intravenous contrast. Angiographic images of the neck were obtained using MRA technique without and with intravenous contast.; Angiographic images of the Circle of Willis were obtained using MRA technique without intravenous contrast. CONTRAST:  1 MULTIHANCE GADOBENATE DIMEGLUMINE 529 MG/ML IV SOLN, 19mL MULTIHANCE GADOBENATE DIMEGLUMINE 529 MG/ML IV SOLN COMPARISON:  09/30/2016 CT head FINDINGS: MRA NECK FINDINGS Aortic arch: Patent. Right common carotid artery: Patent. Right internal carotid artery: Patent. Right vertebral artery: Patent. Left common carotid artery: Patent. Left Internal carotid artery: Patent. Left Vertebral artery: Patent. There is no evidence of high-grade stenosis, dissection, or aneurysm unless noted above. MRA HEAD FINDINGS Internal carotid arteries:  Patent. Anterior cerebral arteries:  Patent. Middle cerebral arteries: Patent.  Fenestrated left M1 segment. Anterior communicating artery: Patent. Posterior communicating arteries: Patent right. No left identified, likely hypoplastic or absent. Posterior cerebral arteries: Patent. Small right P1 segment with large right posterior communicating artery compatible with persistent fetal circulation. Basilar artery:  Patent. Vertebral arteries:  Patent. No evidence of high-grade stenosis, large vessel occlusion, or  aneurysm unless noted above. IMPRESSION: 1. Normal MRA of the neck with and without intravenous contrast. 2. Normal MRA of the head without intravenous contrast. Electronically Signed   By: Mitzi Hansen M.D.   On: 10/15/2016 21:01    ____________________________________________   PROCEDURES  Procedure(s) performed:    Procedures  None ____________________________________________   INITIAL IMPRESSION / ASSESSMENT AND PLAN / ED COURSE  Pertinent labs & imaging results that were available during my care of the patient were reviewed by me and considered in my medical decision making (see chart for details).  Patient presents to the ED from opthalmology clinic for evaluation of a painful Horner's syndrome. Plan for labs and MRA of the head/neck. Patient is comfortable with no other deficits except for the left sided horner's syndrome.   09:10 PM Spoke with Dr. Charlotte Sanes with Opthalmology. Normal MRA of the head and neck. Question if this was a carotid dissection that resolved vs alternate etiology. Plan to start ASA daily and follow with Dr. Charlotte Sanes as an outpatient for the Horner's syndrome. Discussed plan with patient who is pleased at discharge.   At this time, I do not feel there is any life-threatening condition present. I have reviewed and discussed all results (EKG, imaging, lab, urine as appropriate), exam findings with patient. I have reviewed nursing notes and appropriate previous records.  I feel the patient is safe to be discharged home without further emergent workup. Discussed usual and customary return precautions. Patient and family (if present) verbalize understanding and are comfortable with this plan.  Patient will follow-up with their primary care provider. If they do not have a primary care provider, information for follow-up has been provided to them. All questions have been answered.  ____________________________________________  FINAL CLINICAL IMPRESSION(S) / ED DIAGNOSES  Final diagnoses:  Horner's syndrome  Left eye pain     MEDICATIONS GIVEN DURING THIS VISIT:  Medications  gadobenate dimeglumine (MULTIHANCE) injection 20 mL ( Intravenous Canceled Entry 10/15/16 2043)  gadobenate dimeglumine (MULTIHANCE) injection 19 mL (19 mLs Intravenous Contrast Given 10/15/16 2043)      NEW OUTPATIENT MEDICATIONS STARTED DURING THIS VISIT:  None   Note:  This document was prepared using Dragon voice recognition software and may include unintentional dictation errors.  Alona Bene, MD Emergency Medicine   Maia Plan, MD 10/18/16 [phone]

## 2016-10-15 NOTE — ED Provider Notes (Signed)
Note started in error.  Alona Bene, MD   Maia Plan, MD 10/15/16 [phone]

## 2016-10-15 NOTE — Discharge Instructions (Signed)
You were seen in the ED today with eye pain and neurological abnormality called Horner's Syndrome. This is likely resolving but until it does fully you should start taking an 81 mg Aspirin daily. You should follow up with your PCP and Dr. Charlotte Sanes as above.   Return to the ED with any new or worsening symptoms such as worsening headache, fever, chills, or new numbness/weakness.

## 2016-10-19 ENCOUNTER — Other Ambulatory Visit: Payer: Self-pay | Admitting: Ophthalmology

## 2016-10-20 ENCOUNTER — Other Ambulatory Visit: Payer: Self-pay | Admitting: Ophthalmology

## 2016-10-20 DIAGNOSIS — G902 Horner's syndrome: Secondary | ICD-10-CM

## 2016-10-26 ENCOUNTER — Ambulatory Visit
Admission: RE | Admit: 2016-10-26 | Discharge: 2016-10-26 | Disposition: A | Discharge status: Home / Self Care | Payer: BLUE CROSS/BLUE SHIELD | Source: Ambulatory Visit | Attending: Ophthalmology | Admitting: Ophthalmology

## 2016-10-26 DIAGNOSIS — G902 Horner's syndrome: Secondary | ICD-10-CM

## 2016-10-26 MED ORDER — IOPAMIDOL (ISOVUE-370) INJECTION 76%
75.0000 mL | Freq: Once | INTRAVENOUS | Status: AC | PRN
  Administered 2016-10-26: 75 mL via INTRAVENOUS

## 2016-10-31 ENCOUNTER — Ambulatory Visit
Admission: RE | Admit: 2016-10-31 | Discharge: 2016-10-31 | Disposition: A | Discharge status: Home / Self Care | Payer: BLUE CROSS/BLUE SHIELD | Source: Ambulatory Visit | Attending: Ophthalmology | Admitting: Ophthalmology

## 2016-10-31 DIAGNOSIS — G902 Horner's syndrome: Secondary | ICD-10-CM

## 2016-10-31 MED ORDER — GADOBENATE DIMEGLUMINE 529 MG/ML IV SOLN
18.0000 mL | Freq: Once | INTRAVENOUS | Status: AC | PRN
  Administered 2016-10-31: 18 mL via INTRAVENOUS

## 2017-01-26 DIAGNOSIS — Q8740 Marfan's syndrome, unspecified: Secondary | ICD-10-CM | POA: Insufficient documentation

## 2017-01-26 DIAGNOSIS — I779 Disorder of arteries and arterioles, unspecified: Secondary | ICD-10-CM | POA: Insufficient documentation

## 2018-01-15 IMAGING — CT CT ABD-PELV W/ CM
2 of 4 series · 16 of 46 positions shown, 18 images · IV contrast (iopamidol)
Comparison: 10/29/2009

CLINICAL DATA: Weight loss x2 months

EXAM:
CT ABDOMEN AND PELVIS WITH CONTRAST
TECHNIQUE: Multidetector CT imaging of the abdomen and pelvis was performed
using the standard protocol following bolus administration of
intravenous contrast.
CONTRAST:  100mL OB1OYR-SCC IOPAMIDOL (OB1OYR-SCC) INJECTION 61%

[Series 2: abd/ pelvis · axial · 0.72mm/px · z∈[-640,-184]mm · 13 of 101 slices shown, 15 images]
[im 5/101  soft-tissue]
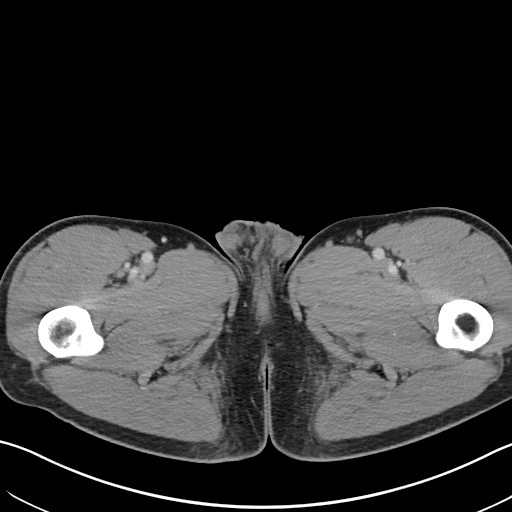
[im 5/101  bone]
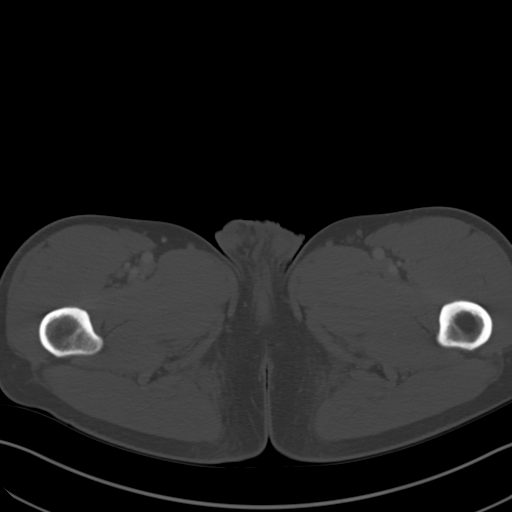
[im 14/101  soft-tissue]
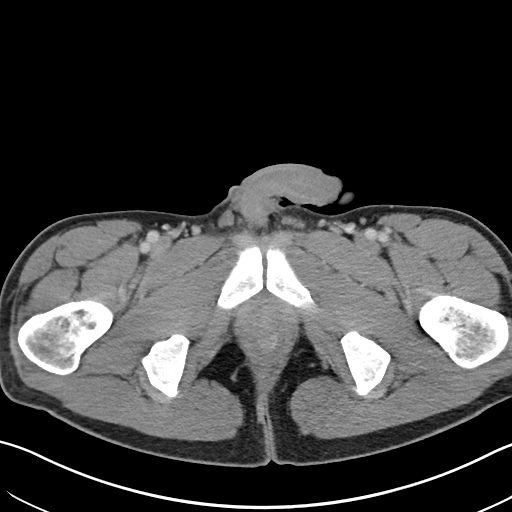
[im 22/101  soft-tissue]
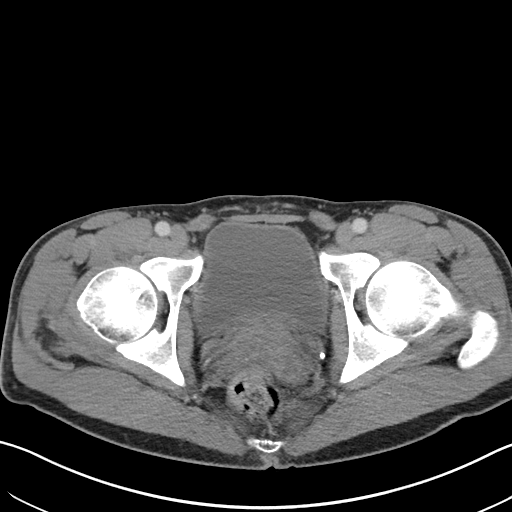
[im 27/101  soft-tissue]
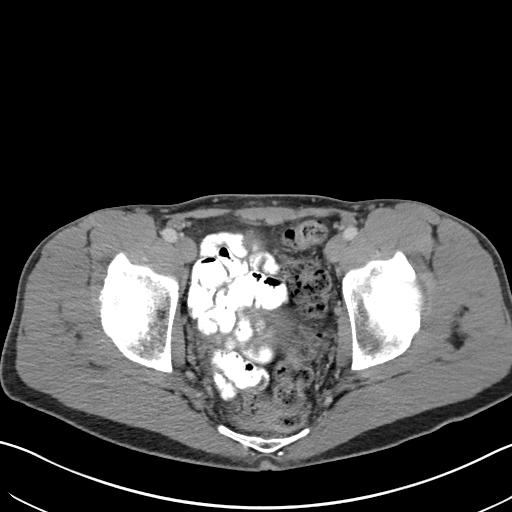
[im 35/101  soft-tissue]
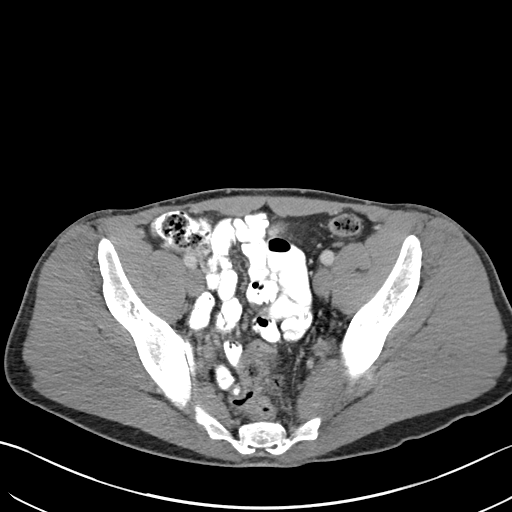
[im 44/101  soft-tissue]
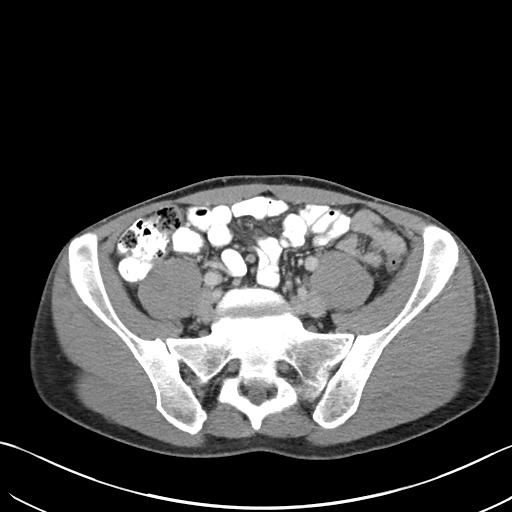
[im 53/101  soft-tissue]
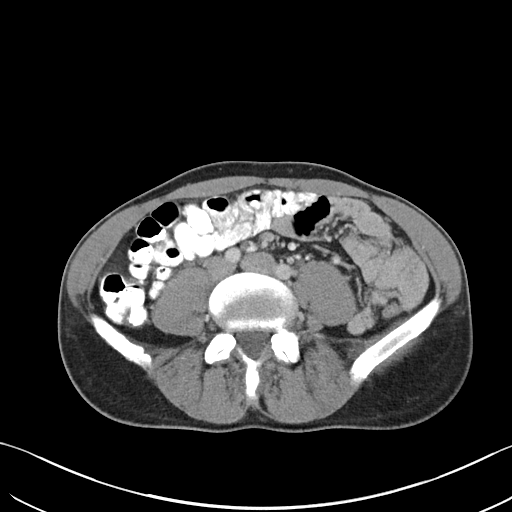
[im 57/101  soft-tissue]
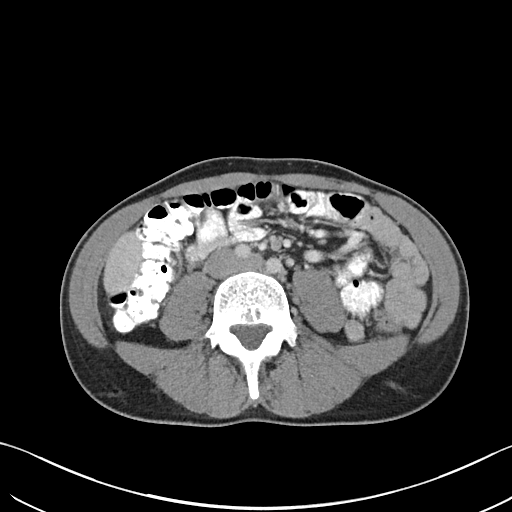
[im 66/101  soft-tissue]
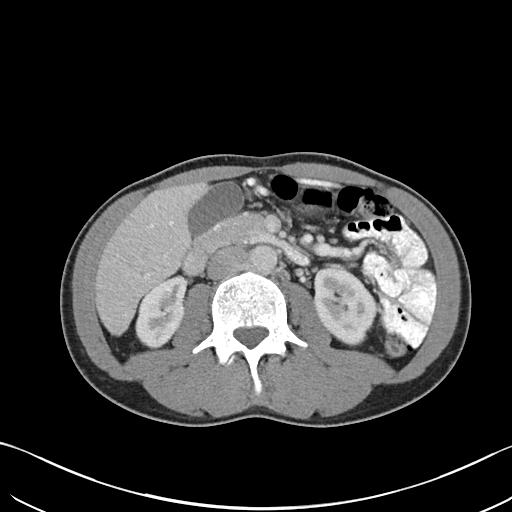
[im 66/101  bone]
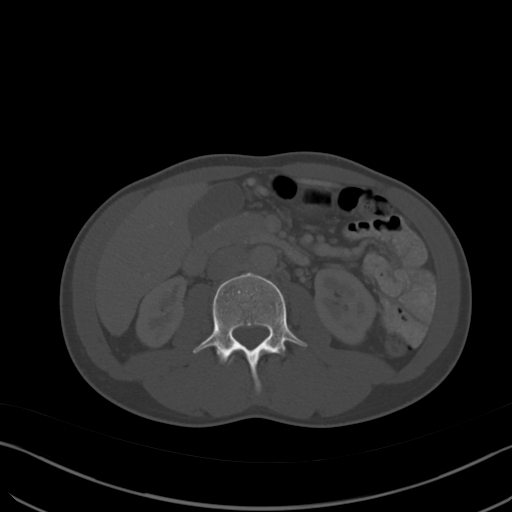
[im 74/101  soft-tissue]
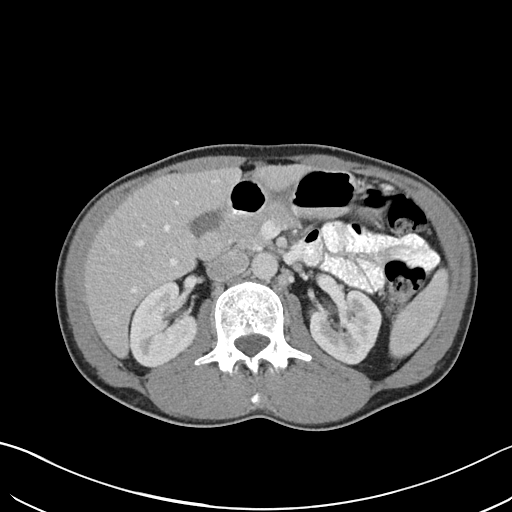
[im 79/101  soft-tissue]
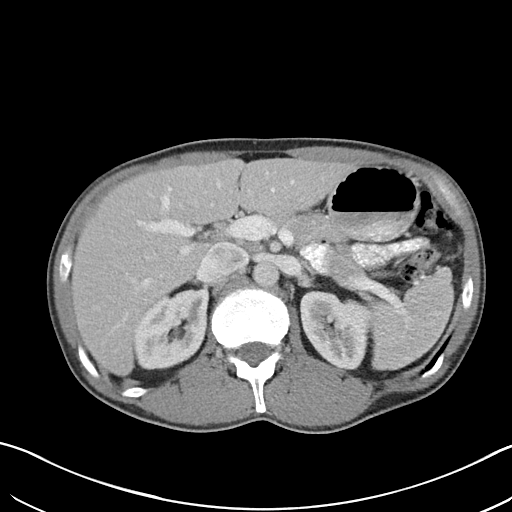
[im 87/101  soft-tissue]
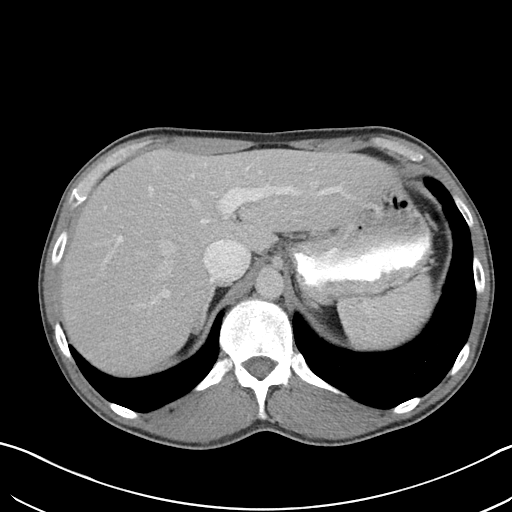
[im 96/101  soft-tissue]
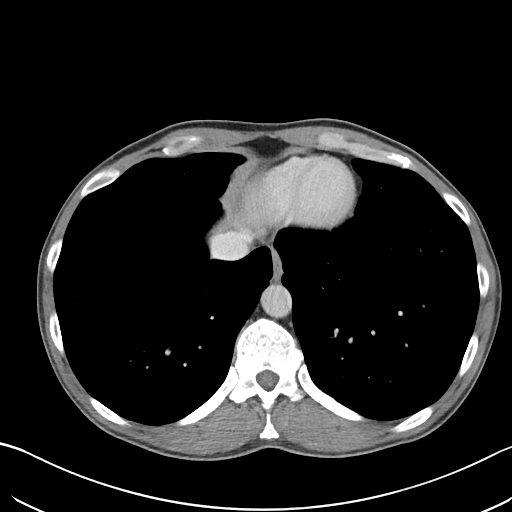

[Series 5: coronal soft tissue · coronal · 0.65mm/px · 3 of 73 slices shown]
[im 25/73  soft-tissue]
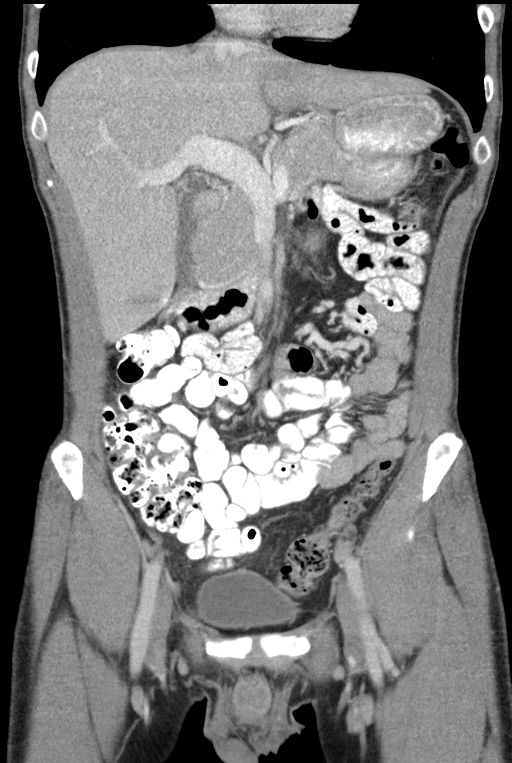
[im 33/73  soft-tissue]
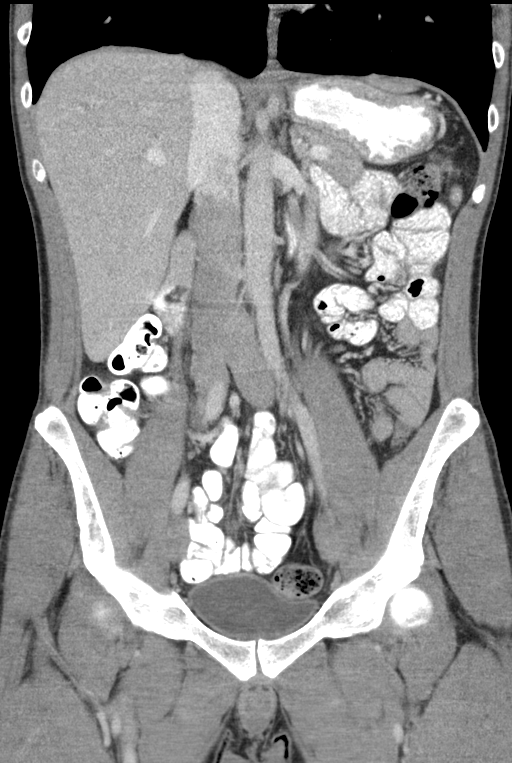
[im 41/73  soft-tissue]
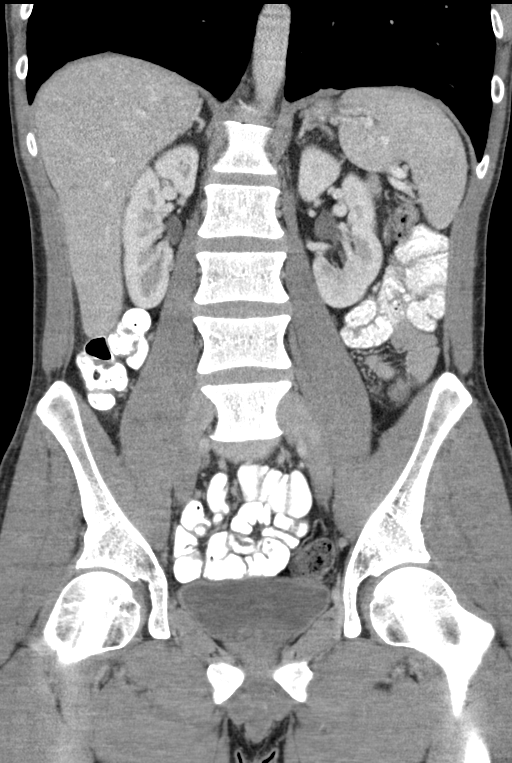

[16 of 46 positions shown; findings below may reference images not displayed]

FINDINGS: Lower chest: Lung bases are clear.

Hepatobiliary: Liver is within normal limits.

Gallbladder is unremarkable. No intrahepatic or extrahepatic ductal
dilatation.

Pancreas: Within normal limits.

Spleen: Within normal limits.

Adrenals/Urinary Tract: Adrenal glands are within normal limits.

Kidneys are within normal limits.  No hydronephrosis.

Bladder is within normal limits.

Stomach/Bowel: Stomach is within normal limits.

No evidence of bowel obstruction.

Appendix is not discretely visualized.

Vascular/Lymphatic: No evidence of abdominal aortic aneurysm.

No suspicious abdominopelvic lymphadenopathy.

Reproductive: Prostate is unremarkable.

Other: No abdominopelvic ascites.

Musculoskeletal: Scattered tiny probable bone islands in the
bilateral pelvis and right proximal femur, unchanged.

Mild degenerative changes at L5-S1.
IMPRESSION: Unremarkable CT abdomen/pelvis. No CT findings to account for the
patient's weight loss.

## 2019-11-14 ENCOUNTER — Ambulatory Visit: Payer: BLUE CROSS/BLUE SHIELD | Admitting: Family Medicine

## 2020-02-04 ENCOUNTER — Ambulatory Visit: Payer: Self-pay | Admitting: Family Medicine

## 2020-02-04 DIAGNOSIS — Z0289 Encounter for other administrative examinations: Secondary | ICD-10-CM

## 2020-05-03 ENCOUNTER — Emergency Department (HOSPITAL_COMMUNITY): Payer: 59

## 2020-05-03 ENCOUNTER — Inpatient Hospital Stay (HOSPITAL_COMMUNITY)
Admission: EM | Admit: 2020-05-03 | Discharge: 2020-05-06 | DRG: 605 | Disposition: A | Discharge status: Home Health | Payer: 59 | Attending: General Surgery | Admitting: General Surgery

## 2020-05-03 DIAGNOSIS — Y92414 Local residential or business street as the place of occurrence of the external cause: Secondary | ICD-10-CM

## 2020-05-03 DIAGNOSIS — M25511 Pain in right shoulder: Secondary | ICD-10-CM | POA: Diagnosis present

## 2020-05-03 DIAGNOSIS — S2241XA Multiple fractures of ribs, right side, initial encounter for closed fracture: Secondary | ICD-10-CM | POA: Diagnosis present

## 2020-05-03 DIAGNOSIS — D62 Acute posthemorrhagic anemia: Secondary | ICD-10-CM | POA: Diagnosis present

## 2020-05-03 DIAGNOSIS — R519 Headache, unspecified: Secondary | ICD-10-CM | POA: Diagnosis not present

## 2020-05-03 DIAGNOSIS — S20319A Abrasion of unspecified front wall of thorax, initial encounter: Secondary | ICD-10-CM | POA: Diagnosis present

## 2020-05-03 DIAGNOSIS — Z23 Encounter for immunization: Secondary | ICD-10-CM

## 2020-05-03 DIAGNOSIS — M25519 Pain in unspecified shoulder: Secondary | ICD-10-CM

## 2020-05-03 DIAGNOSIS — S2249XA Multiple fractures of ribs, unspecified side, initial encounter for closed fracture: Secondary | ICD-10-CM

## 2020-05-03 DIAGNOSIS — Z20822 Contact with and (suspected) exposure to covid-19: Secondary | ICD-10-CM | POA: Diagnosis present

## 2020-05-03 DIAGNOSIS — Y906 Blood alcohol level of 120-199 mg/100 ml: Secondary | ICD-10-CM | POA: Diagnosis present

## 2020-05-03 DIAGNOSIS — T1490XA Injury, unspecified, initial encounter: Secondary | ICD-10-CM

## 2020-05-03 DIAGNOSIS — S0101XA Laceration without foreign body of scalp, initial encounter: Secondary | ICD-10-CM | POA: Diagnosis not present

## 2020-05-03 DIAGNOSIS — S22008A Other fracture of unspecified thoracic vertebra, initial encounter for closed fracture: Secondary | ICD-10-CM

## 2020-05-03 DIAGNOSIS — J939 Pneumothorax, unspecified: Secondary | ICD-10-CM | POA: Diagnosis present

## 2020-05-03 DIAGNOSIS — S27322A Contusion of lung, bilateral, initial encounter: Secondary | ICD-10-CM

## 2020-05-03 DIAGNOSIS — S30810A Abrasion of lower back and pelvis, initial encounter: Secondary | ICD-10-CM | POA: Diagnosis present

## 2020-05-03 DIAGNOSIS — S270XXA Traumatic pneumothorax, initial encounter: Secondary | ICD-10-CM | POA: Diagnosis present

## 2020-05-03 DIAGNOSIS — S42402A Unspecified fracture of lower end of left humerus, initial encounter for closed fracture: Secondary | ICD-10-CM | POA: Diagnosis present

## 2020-05-03 LAB — CBC
HCT: 39.5 % (ref 39.0–52.0)
Hemoglobin: 13.2 g/dL (ref 13.0–17.0)
MCH: 30.9 pg (ref 26.0–34.0)
MCHC: 33.4 g/dL (ref 30.0–36.0)
MCV: 92.5 fL (ref 80.0–100.0)
Platelets: 323 10*3/uL (ref 150–400)
RBC: 4.27 MIL/uL (ref 4.22–5.81)
RDW: 12.2 % (ref 11.5–15.5)
WBC: 12.4 10*3/uL — ABNORMAL HIGH (ref 4.0–10.5)
nRBC: 0 % (ref 0.0–0.2)

## 2020-05-03 MED ORDER — TETANUS-DIPHTH-ACELL PERTUSSIS 5-2.5-18.5 LF-MCG/0.5 IM SUSY
0.5000 mL | PREFILLED_SYRINGE | Freq: Once | INTRAMUSCULAR | Status: AC
  Administered 2020-05-03: 0.5 mL via INTRAMUSCULAR
  Filled 2020-05-03: qty 0.5

## 2020-05-03 MED ORDER — FENTANYL CITRATE (PF) 100 MCG/2ML IJ SOLN
50.0000 ug | Freq: Once | INTRAMUSCULAR | Status: AC
  Administered 2020-05-04: 50 ug via INTRAVENOUS
  Filled 2020-05-03 (×2): qty 2
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
## 2020-05-04 ENCOUNTER — Inpatient Hospital Stay (HOSPITAL_COMMUNITY): Payer: 59

## 2020-05-04 ENCOUNTER — Emergency Department (HOSPITAL_COMMUNITY): Payer: 59

## 2020-05-04 DIAGNOSIS — M25511 Pain in right shoulder: Secondary | ICD-10-CM | POA: Diagnosis present

## 2020-05-04 DIAGNOSIS — D62 Acute posthemorrhagic anemia: Secondary | ICD-10-CM | POA: Diagnosis present

## 2020-05-04 DIAGNOSIS — S2241XA Multiple fractures of ribs, right side, initial encounter for closed fracture: Secondary | ICD-10-CM | POA: Diagnosis present

## 2020-05-04 DIAGNOSIS — R519 Headache, unspecified: Secondary | ICD-10-CM | POA: Diagnosis present

## 2020-05-04 DIAGNOSIS — Z23 Encounter for immunization: Secondary | ICD-10-CM | POA: Diagnosis not present

## 2020-05-04 DIAGNOSIS — S20319A Abrasion of unspecified front wall of thorax, initial encounter: Secondary | ICD-10-CM | POA: Diagnosis present

## 2020-05-04 DIAGNOSIS — Y906 Blood alcohol level of 120-199 mg/100 ml: Secondary | ICD-10-CM | POA: Diagnosis present

## 2020-05-04 DIAGNOSIS — S270XXA Traumatic pneumothorax, initial encounter: Secondary | ICD-10-CM | POA: Diagnosis present

## 2020-05-04 DIAGNOSIS — S0101XA Laceration without foreign body of scalp, initial encounter: Secondary | ICD-10-CM | POA: Diagnosis present

## 2020-05-04 DIAGNOSIS — J939 Pneumothorax, unspecified: Secondary | ICD-10-CM | POA: Diagnosis present

## 2020-05-04 DIAGNOSIS — Y92414 Local residential or business street as the place of occurrence of the external cause: Secondary | ICD-10-CM | POA: Diagnosis not present

## 2020-05-04 DIAGNOSIS — S42402A Unspecified fracture of lower end of left humerus, initial encounter for closed fracture: Secondary | ICD-10-CM | POA: Diagnosis present

## 2020-05-04 DIAGNOSIS — S27322A Contusion of lung, bilateral, initial encounter: Secondary | ICD-10-CM | POA: Diagnosis present

## 2020-05-04 DIAGNOSIS — Z20822 Contact with and (suspected) exposure to covid-19: Secondary | ICD-10-CM | POA: Diagnosis present

## 2020-05-04 DIAGNOSIS — S30810A Abrasion of lower back and pelvis, initial encounter: Secondary | ICD-10-CM | POA: Diagnosis present

## 2020-05-04 LAB — I-STAT CHEM 8, ED
BUN: 17 mg/dL (ref 6–20)
Calcium, Ion: 1.07 mmol/L — ABNORMAL LOW (ref 1.15–1.40)
Chloride: 104 mmol/L (ref 98–111)
Creatinine, Ser: 1.3 mg/dL — ABNORMAL HIGH (ref 0.61–1.24)
Glucose, Bld: 106 mg/dL — ABNORMAL HIGH (ref 70–99)
HCT: 38 % — ABNORMAL LOW (ref 39.0–52.0)
Hemoglobin: 12.9 g/dL — ABNORMAL LOW (ref 13.0–17.0)
Potassium: 3 mmol/L — ABNORMAL LOW (ref 3.5–5.1)
Sodium: 141 mmol/L (ref 135–145)
TCO2: 23 mmol/L (ref 22–32)

## 2020-05-04 LAB — URINALYSIS, ROUTINE W REFLEX MICROSCOPIC
Bacteria, UA: NONE SEEN
Bilirubin Urine: NEGATIVE
Glucose, UA: NEGATIVE mg/dL
Ketones, ur: NEGATIVE mg/dL
Leukocytes,Ua: NEGATIVE
Nitrite: NEGATIVE
Protein, ur: 30 mg/dL — AB
Specific Gravity, Urine: >1.046 — ABNORMAL HIGH (ref 1.005–1.030)
pH: 5 (ref 5.0–8.0)

## 2020-05-04 LAB — COMPREHENSIVE METABOLIC PANEL
ALT: 70 U/L — ABNORMAL HIGH (ref 0–44)
AST: 112 U/L — ABNORMAL HIGH (ref 15–41)
Albumin: 4 g/dL (ref 3.5–5.0)
Alkaline Phosphatase: 51 U/L (ref 38–126)
Anion gap: 10 (ref 5–15)
BUN: 15 mg/dL (ref 6–20)
CO2: 24 mmol/L (ref 22–32)
Calcium: 8.9 mg/dL (ref 8.9–10.3)
Chloride: 104 mmol/L (ref 98–111)
Creatinine, Ser: 1.16 mg/dL (ref 0.61–1.24)
GFR, Estimated: >60 mL/min (ref >60)
Glucose, Bld: 111 mg/dL — ABNORMAL HIGH (ref 70–99)
Potassium: 3.1 mmol/L — ABNORMAL LOW (ref 3.5–5.1)
Sodium: 138 mmol/L (ref 135–145)
Total Bilirubin: 0.8 mg/dL (ref 0.3–1.2)
Total Protein: 6.9 g/dL (ref 6.5–8.1)

## 2020-05-04 LAB — LACTIC ACID, PLASMA: Lactic Acid, Venous: 2.8 mmol/L (ref 0.5–1.9)

## 2020-05-04 LAB — SAMPLE TO BLOOD BANK

## 2020-05-04 LAB — RAPID URINE DRUG SCREEN, HOSP PERFORMED
Amphetamines: NOT DETECTED
Barbiturates: NOT DETECTED
Benzodiazepines: NOT DETECTED
Cocaine: NOT DETECTED
Opiates: NOT DETECTED
Tetrahydrocannabinol: POSITIVE — AB

## 2020-05-04 LAB — PROTIME-INR
INR: 1.1 (ref 0.8–1.2)
Prothrombin Time: 13.6 seconds (ref 11.4–15.2)

## 2020-05-04 LAB — RESPIRATORY PANEL BY RT PCR (FLU A&B, COVID)
Influenza A by PCR: NEGATIVE
Influenza B by PCR: NEGATIVE
SARS Coronavirus 2 by RT PCR: NEGATIVE

## 2020-05-04 LAB — ETHANOL: Alcohol, Ethyl (B): 133 mg/dL — ABNORMAL HIGH (ref <10)

## 2020-05-04 LAB — HIV ANTIBODY (ROUTINE TESTING W REFLEX): HIV Screen 4th Generation wRfx: NONREACTIVE

## 2020-05-04 MED ORDER — MORPHINE SULFATE (PF) 4 MG/ML IV SOLN
4.0000 mg | INTRAVENOUS | Status: DC | PRN
  Administered 2020-05-04 (×2): 4 mg via INTRAVENOUS
  Filled 2020-05-04 (×2): qty 1

## 2020-05-04 MED ORDER — METHOCARBAMOL 750 MG PO TABS
750.0000 mg | ORAL_TABLET | Freq: Three times a day (TID) | ORAL | Status: DC
  Administered 2020-05-04 – 2020-05-06 (×7): 750 mg via ORAL
  Filled 2020-05-04 (×3): qty 1
  Filled 2020-05-04: qty 2
  Filled 2020-05-04 (×3): qty 1

## 2020-05-04 MED ORDER — SODIUM CHLORIDE 0.9 % IV SOLN: INTRAVENOUS | Status: DC

## 2020-05-04 MED ORDER — IOHEXOL 300 MG/ML  SOLN
100.0000 mL | Freq: Once | INTRAMUSCULAR | Status: AC | PRN
  Administered 2020-05-04: 100 mL via INTRAVENOUS

## 2020-05-04 MED ORDER — LIDOCAINE-EPINEPHRINE 1 %-1:100000 IJ SOLN
20.0000 mL | Freq: Once | INTRAMUSCULAR | Status: AC
  Administered 2020-05-04: 2 mL
  Filled 2020-05-04: qty 1

## 2020-05-04 MED ORDER — LIDOCAINE 5 % EX PTCH
1.0000 | MEDICATED_PATCH | CUTANEOUS | Status: DC
  Administered 2020-05-04 – 2020-05-06 (×3): 1 via TRANSDERMAL
  Filled 2020-05-04 (×3): qty 1

## 2020-05-04 MED ORDER — ENOXAPARIN SODIUM 30 MG/0.3ML ~~LOC~~ SOLN
30.0000 mg | Freq: Two times a day (BID) | SUBCUTANEOUS | Status: DC
  Administered 2020-05-05 – 2020-05-06 (×3): 30 mg via SUBCUTANEOUS
  Filled 2020-05-04 (×3): qty 0.3

## 2020-05-04 MED ORDER — ONDANSETRON 4 MG PO TBDP: 4.0000 mg | ORAL_TABLET | Freq: Four times a day (QID) | ORAL | Status: DC | PRN

## 2020-05-04 MED ORDER — ACETAMINOPHEN 500 MG PO TABS
1000.0000 mg | ORAL_TABLET | Freq: Four times a day (QID) | ORAL | Status: DC
  Administered 2020-05-04 – 2020-05-06 (×10): 1000 mg via ORAL
  Filled 2020-05-04 (×11): qty 2

## 2020-05-04 MED ORDER — LACTATED RINGERS IV BOLUS
1000.0000 mL | Freq: Once | INTRAVENOUS | Status: AC
  Administered 2020-05-04: 1000 mL via INTRAVENOUS

## 2020-05-04 MED ORDER — DOCUSATE SODIUM 100 MG PO CAPS
100.0000 mg | ORAL_CAPSULE | Freq: Two times a day (BID) | ORAL | Status: DC
  Administered 2020-05-04 – 2020-05-06 (×5): 100 mg via ORAL
  Filled 2020-05-04 (×5): qty 1

## 2020-05-04 MED ORDER — ONDANSETRON HCL 4 MG/2ML IJ SOLN
4.0000 mg | Freq: Four times a day (QID) | INTRAMUSCULAR | Status: DC | PRN
  Administered 2020-05-04: 4 mg via INTRAVENOUS
  Filled 2020-05-04: qty 2

## 2020-05-04 MED ORDER — OXYCODONE HCL 5 MG PO TABS
5.0000 mg | ORAL_TABLET | ORAL | Status: DC | PRN
  Administered 2020-05-04 – 2020-05-05 (×6): 10 mg via ORAL
  Administered 2020-05-06: 5 mg via ORAL
  Administered 2020-05-06: 10 mg via ORAL
  Administered 2020-05-06: 5 mg via ORAL
  Administered 2020-05-06: 10 mg via ORAL
  Filled 2020-05-04 (×2): qty 2
  Filled 2020-05-04: qty 1
  Filled 2020-05-04: qty 2
  Filled 2020-05-04: qty 1
  Filled 2020-05-04 (×5): qty 2

## 2020-05-04 NOTE — ED Notes (Signed)
MS Breakfast Ordered 

## 2020-05-04 NOTE — Plan of Care (Signed)

## 2020-05-04 NOTE — ED Notes (Addendum)
Patient transported to CT  0035 MD at bedside reevaluating pt, MD ask if c-collar can be change to Aspen collar will try to find one
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
## 2020-05-04 NOTE — Progress Notes (Signed)
Orthopedic Tech Progress Note Patient Details:  [name] 12/23/1975 [id number] Confirmed with RN that the patient no longer needed collar. Patient ID: Hunter Ball, male   DOB: [birth year], 44 y.o.   MRN: [id number]   Gerald Stabs 05/04/2020, 6:51 PM
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
## 2020-05-04 NOTE — Progress Notes (Addendum)
KARANVEER RAMAKRISHNAN is a 44 y.o. male patient admitted. Awake, alert - oriented  X 4 - no acute distress noted.  VSS - Blood pressure 110/68, pulse 72, temperature 98.6 F (37 C), temperature source Oral, resp. rate 17, height 6\' 8"  (2.032 m), weight 95.3 kg, SpO2 100 %.    IV in place, occlusive dsg intact without redness.  Orientation to room, and floor completed.  Admission INP armband ID verified with patient/family, and in place.   SR up x 2, fall assessment complete, with patient and family able to verbalize understanding of risk associated with falls, and verbalized understanding to call nsg before up out of bed.  Call light within reach, patient able to voice, and demonstrate understanding. No evidence of skin break down noted on exam.  Admission nurse notified of admission.     Will cont to eval and treat per MD orders.  , RN 05/04/2020 8:15 PM

## 2020-05-04 NOTE — Procedures (Signed)
Procedure - closure of scalp laceration   Attending - Dr. Emelia Loron   Verbal consent obtained. Area was prepped and draped in typical sterile fashion. Local anesthesia achieved with 3 mL lidocaine. 3 cm laceration was irrigated with normal saline. Closed with 7 staples.   EBL: minimal  Complications: none  Patient tolerated the procedure well without immediate complication.   Wound care: clean daily with mild soap and water. Leave open to air, no dressing required. Staple removal in 5-7 days.   Hosie Spangle, PA-C

## 2020-05-04 NOTE — ED Notes (Signed)
This RN enter pt room to place pt in [place] c-collar, ED MD states can used that c-collar since we do not have the [place],  However upon my arrival pt original c-collar was removed by Neuro Surgeon. Pt states neurosurgeon " says he does not need it" will notified MD for further  clarification

## 2020-05-04 NOTE — ED Notes (Signed)
Pt actively vomiting.

## 2020-05-04 NOTE — ED Notes (Signed)
Date and time results received: 05/04/20 0024 (use smartphrase ".now" to insert current time)  Test: lactic  Critical Value: 2.8  Name of Provider Notified: MD Pollina   Orders Received? Or Actions Taken?: Orders Received - See Orders for details

## 2020-05-04 NOTE — Progress Notes (Signed)
Ortho Trauma Note  Consult received. Left nondisplaced distal humerus fracture. Will plan for nonoperative treatment. Consult to follow later this AM.  Roby Lofts, MD [facility] [phone] (office) [email or web address]

## 2020-05-04 NOTE — Progress Notes (Signed)
Orthopedic Tech Progress Note Patient Details:  Hunter Ball [birth year] [id number]  Ortho Devices Type of Ortho Device: Arm sling, Ball (long arm) splint Ortho Device/Splint Location: lue Ortho Device/Splint Interventions: Ordered, Application, Adjustment   Ball Interventions Patient Tolerated: Well Instructions Provided: Care of device, Adjustment of device   Hunter Ball 05/04/2020, 4:29 AM
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
## 2020-05-05 ENCOUNTER — Inpatient Hospital Stay (HOSPITAL_COMMUNITY): Payer: 59

## 2020-05-05 LAB — BASIC METABOLIC PANEL
Anion gap: 8 (ref 5–15)
BUN: 11 mg/dL (ref 6–20)
CO2: 25 mmol/L (ref 22–32)
Calcium: 8.3 mg/dL — ABNORMAL LOW (ref 8.9–10.3)
Chloride: 105 mmol/L (ref 98–111)
Creatinine, Ser: 0.89 mg/dL (ref 0.61–1.24)
GFR, Estimated: >60 mL/min (ref >60)
Glucose, Bld: 120 mg/dL — ABNORMAL HIGH (ref 70–99)
Potassium: 3.8 mmol/L (ref 3.5–5.1)
Sodium: 138 mmol/L (ref 135–145)

## 2020-05-05 LAB — CBC
HCT: 31.8 % — ABNORMAL LOW (ref 39.0–52.0)
Hemoglobin: 11 g/dL — ABNORMAL LOW (ref 13.0–17.0)
MCH: 31.6 pg (ref 26.0–34.0)
MCHC: 34.6 g/dL (ref 30.0–36.0)
MCV: 91.4 fL (ref 80.0–100.0)
Platelets: 242 10*3/uL (ref 150–400)
RBC: 3.48 MIL/uL — ABNORMAL LOW (ref 4.22–5.81)
RDW: 12.5 % (ref 11.5–15.5)
WBC: 8.3 10*3/uL (ref 4.0–10.5)
nRBC: 0 % (ref 0.0–0.2)

## 2020-05-05 MED ORDER — POTASSIUM CHLORIDE CRYS ER 20 MEQ PO TBCR
40.0000 meq | EXTENDED_RELEASE_TABLET | Freq: Once | ORAL | Status: AC
  Administered 2020-05-05: 40 meq via ORAL
  Filled 2020-05-05: qty 2
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
## 2020-05-05 NOTE — TOC CAGE-AID Note (Signed)
Transition of Care [facility]) - CAGE-AID Screening   Patient Details  Name: Hunter Ball MRN: [id number] Date of Birth: August 06, 1975  Transition [facility]) CM/SW Contact:    Emeterio Reeve, [place] Phone Number: 05/05/2020, 12:55 PM   Clinical Narrative: CSW met with pt at bedside. CSW introduced self and explained her role at the hospital.  Pt reports having 1-2 beers a couple times a week. Pt report having more beers than usual because he was out with his friends. Pt reports occasional marijuana use of 1-2 times a month or less. Pt reports he does not need resources at this time. Pt reports he plans on cutting back his alcohol use on his own.    CAGE-AID Screening:    Have You Ever Felt You Ought to Cut Down on Your Drinking or Drug Use?: Yes Have People Annoyed You By Critizing Your Drinking Or Drug Use?: No Have You Felt Bad Or Guilty About Your Drinking Or Drug Use?: No Have You Ever Had a Drink or Used Drugs First Thing In The Morning to Steady Your Nerves or to Get Rid of a Hangover?: No CAGE-AID Score: 1  Substance Abuse Education Offered: Yes  Substance abuse interventions: Patient Counseling   Emeterio Reeve, Latanya Presser, [place] Social Worker [phone]

## 2020-05-06 LAB — BASIC METABOLIC PANEL
Anion gap: 10 (ref 5–15)
BUN: 8 mg/dL (ref 6–20)
CO2: 25 mmol/L (ref 22–32)
Calcium: 8.6 mg/dL — ABNORMAL LOW (ref 8.9–10.3)
Chloride: 102 mmol/L (ref 98–111)
Creatinine, Ser: 0.88 mg/dL (ref 0.61–1.24)
GFR, Estimated: >60 mL/min (ref >60)
Glucose, Bld: 155 mg/dL — ABNORMAL HIGH (ref 70–99)
Potassium: 3.6 mmol/L (ref 3.5–5.1)
Sodium: 137 mmol/L (ref 135–145)

## 2020-05-06 LAB — CBC
HCT: 34.3 % — ABNORMAL LOW (ref 39.0–52.0)
Hemoglobin: 11.3 g/dL — ABNORMAL LOW (ref 13.0–17.0)
MCH: 30.9 pg (ref 26.0–34.0)
MCHC: 32.9 g/dL (ref 30.0–36.0)
MCV: 93.7 fL (ref 80.0–100.0)
Platelets: 238 10*3/uL (ref 150–400)
RBC: 3.66 MIL/uL — ABNORMAL LOW (ref 4.22–5.81)
RDW: 12.3 % (ref 11.5–15.5)
WBC: 9.5 10*3/uL (ref 4.0–10.5)
nRBC: 0 % (ref 0.0–0.2)

## 2020-05-06 MED ORDER — LIDOCAINE 5 % EX PTCH: 1.0000 | MEDICATED_PATCH | CUTANEOUS | 0 refills | Status: DC

## 2020-05-06 MED ORDER — IBUPROFEN 400 MG PO TABS
400.0000 mg | ORAL_TABLET | Freq: Three times a day (TID) | ORAL | 0 refills | Status: DC | PRN
Start: 2020-05-06 — End: 2021-06-10

## 2020-05-06 MED ORDER — METHOCARBAMOL 750 MG PO TABS: 750.0000 mg | ORAL_TABLET | Freq: Three times a day (TID) | ORAL | 0 refills | Status: DC | PRN

## 2020-05-06 MED ORDER — ACETAMINOPHEN 500 MG PO TABS
1000.0000 mg | ORAL_TABLET | Freq: Four times a day (QID) | ORAL | 0 refills | Status: DC | PRN
Start: 2020-05-06 — End: 2021-06-10

## 2020-05-06 MED ORDER — OXYCODONE HCL 5 MG PO TABS: 10.0000 mg | ORAL_TABLET | Freq: Four times a day (QID) | ORAL | 0 refills | Status: DC | PRN

## 2020-05-06 MED ORDER — DOCUSATE SODIUM 100 MG PO CAPS: 100.0000 mg | ORAL_CAPSULE | Freq: Two times a day (BID) | ORAL | 0 refills | Status: DC

## 2020-05-06 MED ORDER — IBUPROFEN 400 MG PO TABS
400.0000 mg | ORAL_TABLET | Freq: Three times a day (TID) | ORAL | Status: DC
  Administered 2020-05-06: 400 mg via ORAL
  Filled 2020-05-06: qty 1
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
## 2020-05-06 NOTE — Plan of Care (Signed)
  Problem: Education: Goal: Knowledge of General Education information will improve Description: Including pain rating scale, medication(s)/side effects and non-pharmacologic comfort measures 05/06/2020 1104 by Sandford Craze, RN Outcome: Progressing 05/06/2020 1104 by Sandford Craze, RN Outcome: Progressing   Problem: Health Behavior/Discharge Planning: Goal: Ability to manage health-related needs will improve 05/06/2020 1104 by Sandford Craze, RN Outcome: Progressing 05/06/2020 1104 by Sandford Craze, RN Outcome: Progressing   Problem: Clinical Measurements: Goal: Ability to maintain clinical measurements within normal limits will improve 05/06/2020 1104 by Sandford Craze, RN Outcome: Progressing 05/06/2020 1104 by Sandford Craze, RN Outcome: Progressing Goal: Will remain free from infection 05/06/2020 1104 by Sandford Craze, RN Outcome: Progressing 05/06/2020 1104 by Sandford Craze, RN Outcome: Progressing Goal: Diagnostic test results will improve 05/06/2020 1104 by Sandford Craze, RN Outcome: Progressing 05/06/2020 1104 by Sandford Craze, RN Outcome: Progressing Goal: Respiratory complications will improve 05/06/2020 1104 by Sandford Craze, RN Outcome: Progressing 05/06/2020 1104 by Sandford Craze, RN Outcome: Progressing Goal: Cardiovascular complication will be avoided 05/06/2020 1104 by Sandford Craze, RN Outcome: Progressing 05/06/2020 1104 by Sandford Craze, RN Outcome: Progressing   Problem: Activity: Goal: Risk for activity intolerance will decrease 05/06/2020 1104 by Sandford Craze, RN Outcome: Progressing 05/06/2020 1104 by Sandford Craze, RN Outcome: Progressing   Problem: Nutrition: Goal: Adequate nutrition will be maintained 05/06/2020 1104 by Sandford Craze, RN Outcome: Progressing 05/06/2020 1104 by Sandford Craze, RN Outcome: Progressing   Problem: Coping: Goal: Level of anxiety will decrease 05/06/2020 1104 by Sandford Craze, RN Outcome:  Progressing 05/06/2020 1104 by Sandford Craze, RN Outcome: Progressing   Problem: Elimination: Goal: Will not experience complications related to bowel motility 05/06/2020 1104 by Sandford Craze, RN Outcome: Progressing 05/06/2020 1104 by Sandford Craze, RN Outcome: Progressing Goal: Will not experience complications related to urinary retention 05/06/2020 1104 by Sandford Craze, RN Outcome: Progressing 05/06/2020 1104 by Sandford Craze, RN Outcome: Progressing   Problem: Pain Managment: Goal: General experience of comfort will improve 05/06/2020 1104 by Sandford Craze, RN Outcome: Progressing 05/06/2020 1104 by Sandford Craze, RN Outcome: Progressing   Problem: Safety: Goal: Ability to remain free from injury will improve 05/06/2020 1104 by Sandford Craze, RN Outcome: Progressing 05/06/2020 1104 by Sandford Craze, RN Outcome: Progressing   Problem: Skin Integrity: Goal: Risk for impaired skin integrity will decrease 05/06/2020 1104 by Sandford Craze, RN Outcome: Progressing 05/06/2020 1104 by Sandford Craze, RN Outcome: Progressing

## 2020-05-06 NOTE — Plan of Care (Signed)

## 2020-05-06 NOTE — Progress Notes (Signed)
Discharge instructions (including medications) will be  discussed with and a copy will be provided to patient.  
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
## 2020-05-10 ENCOUNTER — Other Ambulatory Visit (HOSPITAL_COMMUNITY): Payer: Self-pay | Admitting: Orthopedic Surgery

## 2020-05-10 ENCOUNTER — Other Ambulatory Visit: Payer: Self-pay | Admitting: Surgical

## 2020-05-10 ENCOUNTER — Other Ambulatory Visit (HOSPITAL_COMMUNITY): Payer: Self-pay | Admitting: Surgical

## 2020-05-10 MED ORDER — OXYCODONE HCL 5 MG PO TABS: 5.0000 mg | ORAL_TABLET | Freq: Four times a day (QID) | ORAL | 0 refills | Status: DC | PRN

## 2020-05-10 MED ORDER — OXYCODONE HCL 5 MG PO TABS: 5.0000 mg | ORAL_TABLET | ORAL | 0 refills | Status: DC | PRN

## 2020-05-10 NOTE — Addendum Note (Signed)
Addended by: Armida Sans on: 05/10/2020 04:20 PM   Modules accepted: Orders

## 2021-06-10 ENCOUNTER — Ambulatory Visit (INDEPENDENT_AMBULATORY_CARE_PROVIDER_SITE_OTHER): Payer: 59 | Admitting: Infectious Disease

## 2021-06-10 ENCOUNTER — Encounter: Payer: Self-pay | Admitting: Infectious Disease

## 2021-06-10 ENCOUNTER — Other Ambulatory Visit: Payer: Self-pay

## 2021-06-10 VITALS — BP 138/82 | HR 93 | Temp 97.9°F | Resp 16 | Ht 78.0 in | Wt 210.7 lb

## 2021-06-10 DIAGNOSIS — Q8740 Marfan's syndrome, unspecified: Secondary | ICD-10-CM

## 2021-06-10 DIAGNOSIS — Z77120 Contact with and (suspected) exposure to mold (toxic): Secondary | ICD-10-CM

## 2021-06-10 HISTORY — DX: Contact with and (suspected) exposure to mold (toxic): Z77.120
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
## 2021-06-11 LAB — COMPLETE METABOLIC PANEL WITH GFR
AG Ratio: 1.6 (calc) (ref 1.0–2.5)
ALT: 13 U/L (ref 9–46)
AST: 14 U/L (ref 10–40)
Albumin: 4.6 g/dL (ref 3.6–5.1)
Alkaline phosphatase (APISO): 53 U/L (ref 36–130)
BUN: 14 mg/dL (ref 7–25)
CO2: 22 mmol/L (ref 20–32)
Calcium: 9.5 mg/dL (ref 8.6–10.3)
Chloride: 106 mmol/L (ref 98–110)
Creat: 1.01 mg/dL (ref 0.60–1.29)
Globulin: 2.8 g/dL (calc) (ref 1.9–3.7)
Glucose, Bld: 82 mg/dL (ref 65–99)
Potassium: 5 mmol/L (ref 3.5–5.3)
Sodium: 141 mmol/L (ref 135–146)
Total Bilirubin: 0.9 mg/dL (ref 0.2–1.2)
Total Protein: 7.4 g/dL (ref 6.1–8.1)
eGFR: 94 mL/min/{1.73_m2} (ref >=60)

## 2021-06-11 LAB — CBC WITH DIFFERENTIAL/PLATELET
Absolute Monocytes: 525 cells/uL (ref 200–950)
Basophils Absolute: 32 cells/uL (ref 0–200)
Basophils Relative: 0.5 %
Eosinophils Absolute: 160 cells/uL (ref 15–500)
Eosinophils Relative: 2.5 %
HCT: 40.7 % (ref 38.5–50.0)
Hemoglobin: 13.8 g/dL (ref 13.2–17.1)
Lymphs Abs: 1306 cells/uL (ref 850–3900)
MCH: 30.9 pg (ref 27.0–33.0)
MCHC: 33.9 g/dL (ref 32.0–36.0)
MCV: 91.1 fL (ref 80.0–100.0)
MPV: 10.5 fL (ref 7.5–12.5)
Monocytes Relative: 8.2 %
Neutro Abs: 4378 cells/uL (ref 1500–7800)
Neutrophils Relative %: 68.4 %
Platelets: 247 10*3/uL (ref 140–400)
RBC: 4.47 10*6/uL (ref 4.20–5.80)
RDW: 12.2 % (ref 11.0–15.0)
Total Lymphocyte: 20.4 %
WBC: 6.4 10*3/uL (ref 3.8–10.8)

## 2021-07-15 ENCOUNTER — Encounter (HOSPITAL_COMMUNITY): Payer: Self-pay | Admitting: *Deleted

## 2021-07-15 ENCOUNTER — Emergency Department (HOSPITAL_COMMUNITY): Payer: Worker's Compensation

## 2021-07-15 ENCOUNTER — Other Ambulatory Visit: Payer: Self-pay

## 2021-07-15 ENCOUNTER — Emergency Department (HOSPITAL_COMMUNITY)
Admission: EM | Admit: 2021-07-15 | Discharge: 2021-07-15 | Disposition: A | Discharge status: Home / Self Care | Payer: Worker's Compensation | Attending: Emergency Medicine | Admitting: Emergency Medicine

## 2021-07-15 DIAGNOSIS — M5382 Other specified dorsopathies, cervical region: Secondary | ICD-10-CM

## 2021-07-15 DIAGNOSIS — R29898 Other symptoms and signs involving the musculoskeletal system: Secondary | ICD-10-CM | POA: Diagnosis not present

## 2021-07-15 HISTORY — DX: Horner's syndrome: G90.2

## 2021-07-15 MED ORDER — ONDANSETRON HCL 4 MG PO TABS: 4.0000 mg | ORAL_TABLET | Freq: Four times a day (QID) | ORAL | 0 refills | Status: DC

## 2021-07-15 NOTE — ED Notes (Signed)
Dc instructions and scripts reviewed with pt. Pt will follow up with ENT. No questions or concerns at this time.

## 2021-07-15 NOTE — ED Provider Notes (Signed)
[facility]-EMERGENCY DEPT Provider Note   CSN: [id number] Arrival date & time: 07/15/21  1258     History  Chief Complaint  Patient presents with   Dry Heaves   Throat Problem    Hunter Ball is a 46 y.o. male with past medical history of left-sided carotid artery disease, Horner syndrome, Marfan's who presents emergency department with throat clicking.  Patient states that for the past few days he has had a clicking sensation when he swallows on the left side of his throat.  He states that it is worse when his head is forward or to the left and improves when his head is turned to the right.  Nothing makes it worse.  He has not tried anything for the symptoms.  He does not think he has swallowed something that is stuck.  He goes on to state that he has a long history of mold exposure at work which has resulted in frequent dry heaving which she thinks exacerbates this problem.  He states that last time this happened he had such severe dry heaving that he started having pain behind his left eye.  This resulted in him being seen in the emergency department for Horner syndrome and possible left-sided carotid artery dissection.  He states that since then he has been doing well other than the clicking continues intermittently.  He is very concerned that because he is to return to work he continues to have dry heaving and will have another incident that resolved and carotid artery dissection.  HPI     Home Medications Prior to Admission medications   Medication Sig Start Date End Date Taking? Authorizing Provider  Multiple Vitamin (MULTIVITAMIN ADULT) TABS Take 1 packet by mouth daily.    [provider]      Allergies    Patient has no known allergies.    Review of Systems   Review of Systems  Constitutional:  Negative for appetite change and fever.  HENT:  Negative for facial swelling, sore throat, trouble swallowing and voice change.   Respiratory:   Negative for choking.   Gastrointestinal:  Positive for nausea.  Musculoskeletal:  Negative for neck pain.       Neck clicking  Neurological:  Negative for dizziness, facial asymmetry, speech difficulty, weakness, light-headedness and headaches.  All other systems reviewed and are negative.  Physical Exam Updated Vital Signs BP (!) 134/103 (BP Location: Left Arm)    Pulse (!) 104    Temp 98.1 F (36.7 C) (Oral)    Resp 18    Ht 6\' 7"  (2.007 m)    Wt 95.3 kg    SpO2 97%    BMI 23.66 kg/m  Physical Exam Vitals and nursing note reviewed.  Constitutional:      General: He is not in acute distress.    Appearance: Normal appearance. He is not ill-appearing or toxic-appearing.  HENT:     Head: Normocephalic and atraumatic.     Nose: Nose normal.     Mouth/Throat:     Mouth: Mucous membranes are moist.     Pharynx: Oropharynx is clear. No posterior oropharyngeal erythema.  Eyes:     General: No scleral icterus.    Extraocular Movements: Extraocular movements intact.     Conjunctiva/sclera: Conjunctivae normal.     Pupils: Pupils are equal, round, and reactive to light.  Neck:     Vascular: No carotid bruit.  Cardiovascular:     Rate and Rhythm: Normal rate.  Pulses: Normal pulses.     Heart sounds: No murmur heard. Pulmonary:     Effort: Pulmonary effort is normal. No respiratory distress.  Musculoskeletal:     Cervical back: Normal range of motion and neck supple. No rigidity or tenderness.  Skin:    General: Skin is warm and dry.     Capillary Refill: Capillary refill takes less than 2 seconds.     Findings: No rash.  Neurological:     General: No focal deficit present.     Mental Status: He is alert and oriented to person, place, and time. Mental status is at baseline.  Psychiatric:        Mood and Affect: Mood normal.        Behavior: Behavior normal.        Thought Content: Thought content normal.        Judgment: Judgment normal.    ED Results / Procedures /  Treatments   Labs (all labs ordered are listed, but only abnormal results are displayed) Labs Reviewed - No data to display  EKG None  Radiology DG Neck Soft Tissue  Result Date: 07/15/2021 CLINICAL DATA:  Globus sensation. Abnormal sensation with swallowing. EXAM: NECK SOFT TISSUES - 1+ VIEW COMPARISON:  CT 10/26/2016 FINDINGS: Soft tissue and air shadows appear normal. No evidence of foreign object. No air/gas in the soft tissues. Bone and cartilage structures appear normal. IMPRESSION: Normal radiographs. Electronically Signed   By: Nelson Chimes M.D.   On: 07/15/2021 14:30    Procedures Procedures    Medications Ordered in ED Medications - No data to display  ED Course/ Medical Decision Making/ A&P                           Medical Decision Making This patient presents to the ED for concern of clicking sensation, this involves an extensive number of treatment options, and is a complaint that carries with it a low risk of complications and morbidity.  The differential diagnosis includes foreign body   Co morbidities that complicate the patient evaluation  Portions are   Additional history obtained:  Additional history obtained from records External records from outside source obtained and reviewed including emergency department visit for coronary syndrome, follow-up ENT visits  Imaging Studies ordered:  I ordered imaging studies including x-ray soft tissue neck I independently visualized and interpreted imaging which showed no foreign bodies, no air or gas otherwise unremarkable I agree with the radiologist interpretation  Test Considered:  None required   Problem List / ED Course:  Clicking sensation 46 year old male who presents emergency department with concern for neck clicking when he swallows.  Patient is mostly concerned that the dry heaving that he has due to his mold exposure will cause dissection.  I reviewed the medical records in depth which shows that  he has had the same presentation prior.  He did have what sounds like throbbing behind his left thigh after severe dry heaving incident.  He presented to ophthalmology with who had concern for acute Horner syndrome due to left carotid artery dissection.  He was sent to the emergency department which showed on MRI that there is no carotid artery dissection, however there was thought that he may have had a self resolving small dissection causing the Horner syndrome.  He has been seen by 2 different ENT physicians regarding to the clicking sensation in his neck.  Both have thought about catching of his  thyroid cartilage when he is swallowing.  I do not see any follow-up where there has been resolution or medication or intervention.  Overall the patient is nontoxic-appearing.  He is well-appearing.  His airway is patent and there is no stridor.  There is no swelling of his neck concerning for RPA, PTA, Ludwick's angina.  There are no cervical adenopathy is concerning for malignancy or infection.  There is no foreign body on imaging.   Reevaluation:  After the interventions noted above, I reevaluated the patient and found that they have :stayed the same   Dispostion:  After consideration of the diagnostic results and the patients response to treatment, I feel that the patent would benefit from discharge with ENT follow-up.  Discussed with patient that I will provide him with Zofran given his concern for dry heaving.  This will hopefully alleviate any nausea that he is having and prevent him from dry heaving.  He has been seen by ENT twice before and been referred to a second ENT for second opinion.  I think that it is prudent for him to follow-up with ENT for resolution of the clicking issue.  However his presentation is overall reassuring here.  There is no intervention needed at this time.  Discussed him following up with ENT and he verbalized understanding.  He is okay with this plan.  Instructed to return  for any airway compromise, neurological deficits.  He verbalized understanding.  No need for admission.  He stay for discharge.  Final Clinical Impression(s) / ED Diagnoses Final diagnoses:  Clicking neck    Rx / DC Orders ED Discharge Orders          Ordered    ondansetron (ZOFRAN) 4 MG tablet  Every 6 hours        07/15/21 1539              Mickie Hillier, PA-C 07/16/21 0701    Valarie Merino, MD 07/16/21 1451

## 2021-07-15 NOTE — ED Triage Notes (Signed)
Pt states when he feels as if he has a clicking in the left side of throat every time he swallows. Pt reports dry heaves a week ago which made it worse.

## 2021-07-15 NOTE — Discharge Instructions (Addendum)
You are seen in the Emergency Department today for a clicking sensation in your neck.  Please follow-up with ENT to schedule an outpatient visit to be evaluated.  Please return to the emergency department for any worsening symptoms.  In the meantime I will prescribe you Zofran to keep you from dry heaving and possible worsening of your symptoms.

## 2021-07-16 ENCOUNTER — Telehealth: Payer: Self-pay | Admitting: Infectious Disease

## 2021-07-16 ENCOUNTER — Encounter: Payer: Self-pay | Admitting: Infectious Disease

## 2021-07-16 DIAGNOSIS — M542 Cervicalgia: Secondary | ICD-10-CM

## 2021-07-16 NOTE — Telephone Encounter (Signed)
Hunter Ball, who I saw in ID clinic for concern re mold exposure ( but who is also my neighbor) r3eached out to me to see if I knew if I could help him in to an appt with ENT  He was having a clicking sound when he swallows which is causing him to gag and vomit at times.  He was in ER at [facility] and is now taking several days off from work. He was hoping to get in sooner but worried it may take some time. He does not have PCP at present  He believes referral made from ER for ENT  Looks like he h ad similar problem several years ago and worked up by [employer] like he saw Christia Reading who is one of ppl I would recommend but I dont have Dwight;s cell anymore  Can we look and see if referral has been made and if notg I am happy to make one for Hunter Ball esp since he still does not to my knowledge have PCP (last one dropped him because he wasn't seen for a few years during COVID if I understand)

## 2021-07-16 NOTE — Telephone Encounter (Signed)
can I get an urgent referral for ENT for Dr Zenaida Niece Dams patient MRN:  [id number] and they will get him in sooner he has an appointment on 08-06-21 but if they get urgent referral will see him sooner

## 2021-07-16 NOTE — Addendum Note (Signed)
Addended by: Lucie Leather D on: 07/16/2021 04:11 PM   Modules accepted: Orders

## 2021-08-12 ENCOUNTER — Ambulatory Visit (INDEPENDENT_AMBULATORY_CARE_PROVIDER_SITE_OTHER): Payer: 59 | Admitting: Family Medicine

## 2021-08-12 ENCOUNTER — Encounter (HOSPITAL_BASED_OUTPATIENT_CLINIC_OR_DEPARTMENT_OTHER): Payer: Self-pay | Admitting: Family Medicine

## 2021-08-12 ENCOUNTER — Other Ambulatory Visit: Payer: Self-pay

## 2021-08-12 VITALS — BP 116/70 | HR 86 | Ht 79.0 in | Wt 206.2 lb

## 2021-08-12 DIAGNOSIS — H9312 Tinnitus, left ear: Secondary | ICD-10-CM | POA: Insufficient documentation

## 2021-08-12 DIAGNOSIS — Z0000 Encounter for general adult medical examination without abnormal findings: Secondary | ICD-10-CM

## 2021-08-12 DIAGNOSIS — M542 Cervicalgia: Secondary | ICD-10-CM | POA: Diagnosis not present
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
## 2021-08-12 NOTE — Patient Instructions (Addendum)
°  Medication Instructions:  Your physician recommends that you continue on your current medications as directed. Please refer to the Current Medication list given to you today. --If you need a refill on any your medications before your next appointment, please call your pharmacy first. If no refills are authorized on file call the office.-- Referrals/Procedures/Imaging: A referral has been placed for you to an ENT for evaluation and treatment. Someone from the scheduling department will be in contact with you in regards to coordinating your consultation. If you do not hear from any of the schedulers within 7-10 business days please give their office a call.  [facility] [id number],  [place], [place] [address] Phone: [phone]  Follow-Up: Your next appointment:   Your physician recommends that you schedule a follow-up appointment in: 2 MONTHS for CPE with Dr. de [place]  You will receive a text message or e-mail with a link to a survey about your care and experience with [place] today! We would greatly appreciate your feedback!   Thanks for letting us be apart of your health journey!!  Primary Care and Sports Medicine   Dr. Arlina Robes [place]   We encourage you to activate your patient portal called "MyChart".  Sign up information is provided on this After Visit Summary.  MyChart is used to connect with patients for Virtual Visits (Telemedicine).  Patients are able to view lab/test results, encounter notes, upcoming appointments, etc.  Non-urgent messages can be sent to your provider as well. To learn more about what you can do with MyChart, please visit --  [email or web address].

## 2021-08-24 NOTE — Assessment & Plan Note (Addendum)
Uncertain etiology, does have appointment scheduled ENT, however is interested in ENT referral being placed in order to check if he may be able to get in sooner elsewhere.  Referral placed today

## 2021-08-24 NOTE — Assessment & Plan Note (Signed)
Associated with clicking of left ear as above, referral to ENT at this time

## 2021-09-21 ENCOUNTER — Telehealth: Payer: Self-pay

## 2021-09-21 NOTE — Telephone Encounter (Signed)
CALLED TO SCHEDULE NEW PATIENT APPOINTMENT IN OFFICE NO ANSWER AND VOICEMAIL WAS FULL ?

## 2021-09-22 ENCOUNTER — Telehealth: Payer: Self-pay

## 2021-09-22 NOTE — Telephone Encounter (Signed)
Called no answer voicemail full letter sent  

## 2021-10-06 ENCOUNTER — Ambulatory Visit (HOSPITAL_BASED_OUTPATIENT_CLINIC_OR_DEPARTMENT_OTHER): Payer: 59

## 2021-10-13 ENCOUNTER — Encounter (HOSPITAL_BASED_OUTPATIENT_CLINIC_OR_DEPARTMENT_OTHER): Payer: Self-pay | Admitting: Family Medicine

## 2021-10-13 ENCOUNTER — Ambulatory Visit (INDEPENDENT_AMBULATORY_CARE_PROVIDER_SITE_OTHER): Payer: 59 | Admitting: Family Medicine

## 2021-10-13 VITALS — BP 110/76 | HR 75 | Ht 79.0 in | Wt 213.6 lb

## 2021-10-13 DIAGNOSIS — Z0000 Encounter for general adult medical examination without abnormal findings: Secondary | ICD-10-CM

## 2021-10-13 DIAGNOSIS — Z1211 Encounter for screening for malignant neoplasm of colon: Secondary | ICD-10-CM

## 2021-10-13 NOTE — Progress Notes (Signed)
?Subjective:   ? ?CC: Annual Physical Exam ? ?HPI:  ?Hunter Ball is a 46 y.o. presenting for annual physical ? ?I reviewed the past medical history, family history, social history, surgical history, and allergies today and no changes were needed.  Please see the problem list section below in epic for further details. ? ?Past Medical History: ?Past Medical History:  ?Diagnosis Date  ? Exposure to mold 06/10/2021  ? Horner's syndrome   ? ?Past Surgical History: ?Past Surgical History:  ?Procedure Laterality Date  ? HAND SURGERY    ? NOSE SURGERY    ? orthopedic surgeries    ? RESECTION DISTAL CLAVICAL    ? ?Social History: ?Social History  ? ?Socioeconomic History  ? Marital status: Married  ?  Spouse name: Not on file  ? Number of children: 1  ? Years of education: Not on file  ? Highest education level: Not on file  ?Occupational History  ? Occupation: Oncologist  ?Tobacco Use  ? Smoking status: Never  ? Smokeless tobacco: Never  ?Vaping Use  ? Vaping Use: Never used  ?Substance and Sexual Activity  ? Alcohol use: Yes  ?  Comment: occasional  ? Drug use: Not Currently  ?  Types: Marijuana  ?  Comment: Rarely  ? Sexual activity: Not on file  ?Other Topics Concern  ? Not on file  ?Social History Narrative  ? Diet: healthy for the last 2-3 years  ? Exercise: has a physical job, no regular exercise   ?   ?   ? ?Social Determinants of Health  ? ?Financial Resource Strain: Not on file  ?Food Insecurity: Not on file  ?Transportation Needs: Not on file  ?Physical Activity: Not on file  ?Stress: Not on file  ?Social Connections: Not on file  ? ?Family History: ?Family History  ?Problem Relation Age of Onset  ? Valvular heart disease Father   ? Colon cancer Neg Hx   ? Prostate cancer Neg Hx   ? CAD Neg Hx   ? Diabetes Neg Hx   ? Hypertension Neg Hx   ? Thyroid disease Neg Hx   ? ?Allergies: ?No Known Allergies ?Medications: See med rec. ? ?Review of Systems: No headache, visual changes,  nausea, vomiting, diarrhea, constipation, dizziness, abdominal pain, skin rash, fevers, chills, night sweats, swollen lymph nodes, weight loss, chest pain, body aches, joint swelling, muscle aches, shortness of breath, mood changes, visual or auditory hallucinations. ? ?Objective:   ? ?BP 110/76   Pulse 75   Ht 6\' 7"  (2.007 m)   Wt 213 lb 9.6 oz (96.9 kg)   SpO2 (!) 84%   BMI 24.06 kg/m?  ? ?General: Well Developed, well nourished, and in no acute distress.  ?Neuro: Alert and oriented x3, extra-ocular muscles intact, sensation grossly intact. Cranial nerves II through XII are intact, motor, sensory, and coordinative functions are all intact. ?HEENT: Normocephalic, atraumatic, pupils equal round reactive to light, neck supple, no masses, no lymphadenopathy, thyroid nonpalpable. Oropharynx, nasopharynx, external ear canals are unremarkable. ?Skin: Warm and dry, no rashes noted.  ?Cardiac: Regular rate and rhythm, no murmurs rubs or gallops.  ?Respiratory: Clear to auscultation bilaterally. Not using accessory muscles, speaking in full sentences.  ?Abdominal: Soft, nontender, nondistended, positive bowel sounds, no masses, no organomegaly.  ?Musculoskeletal: Shoulder, elbow, wrist, hip, knee, ankle stable, and with full range of motion. ? ?Impression and Recommendations:   ? ?Wellness examination ?Routine HCM labs ordered - to be completed today.  HCM reviewed/discussed. Anticipatory guidance regarding healthy weight, lifestyle and choices given. ?Recommend healthy diet.  Recommend approximately 150 minutes/week of moderate intensity exercise ?Recommend regular dental and vision exams ?Always use seatbelt/lap and shoulder restraints ?Recommend using smoke alarms and checking batteries at least twice a year ?Recommend using sunscreen when outside ?Will place referral to GI to begin process of arranging screening colonoscopy given he will be 46 in a couple months ? ?Plan for follow-up in 1 year or sooner as  needed ? ? ?___________________________________________ ?Ivana Nicastro [place], MD, ABFM, CAQSM ?Primary Care and Sports Medicine ?[place] ?

## 2021-10-13 NOTE — Patient Instructions (Signed)
?  Medication Instructions:  ?Your physician recommends that you continue on your current medications as directed. Please refer to the Current Medication list given to you today. ?--If you need a refill on any your medications before your next appointment, please call your pharmacy first. If no refills are authorized on file call the office.-- ?Lab Work: ?Your physician has recommended that you have lab work today: full panel ?If you have labs (blood work) drawn today and your tests are completely normal, you will receive your results via MyChart message OR a phone call from our staff.  ?Please ensure you check your voicemail in the event that you authorized detailed messages to be left on a delegated number. If you have any lab test that is abnormal or we need to change your treatment, we will call you to review the results. ? ?Referrals/Procedures/Imaging: ?Gastrology ? ?Follow-Up: ?Your next appointment:   ?Your physician recommends that you schedule a follow-up appointment in: 1 year follow up with Dr. Tommi Rumps [place] ? ?You will receive a text message or e-mail with a link to a survey about your care and experience with [place] today! We would greatly appreciate your feedback!  ? ?Thanks for letting us be apart of your health journey!!  ?Primary Care and Sports Medicine  ? ?Dr. Marcy Salvo de [place]  ? ?We encourage you to activate your patient portal called "MyChart".  Sign up information is provided on this After Visit Summary.  MyChart is used to connect with patients for Virtual Visits (Telemedicine).  Patients are able to view lab/test results, encounter notes, upcoming appointments, etc.  Non-urgent messages can be sent to your provider as well. To learn more about what you can do with MyChart, please visit --  [email or web address].   ? ?

## 2021-10-13 NOTE — Assessment & Plan Note (Addendum)
Routine HCM labs ordered - to be completed today. HCM reviewed/discussed. Anticipatory guidance regarding healthy weight, lifestyle and choices given. ?Recommend healthy diet.  Recommend approximately 150 minutes/week of moderate intensity exercise ?Recommend regular dental and vision exams ?Always use seatbelt/lap and shoulder restraints ?Recommend using smoke alarms and checking batteries at least twice a year ?Recommend using sunscreen when outside ?Will place referral to GI to begin process of arranging screening colonoscopy given he will be 46 in a couple months ?

## 2021-10-14 LAB — COMPREHENSIVE METABOLIC PANEL
ALT: 17 IU/L (ref 0–44)
AST: 23 IU/L (ref 0–40)
Albumin/Globulin Ratio: 2 (ref 1.2–2.2)
Albumin: 4.9 g/dL (ref 4.0–5.0)
Alkaline Phosphatase: 64 IU/L (ref 44–121)
BUN/Creatinine Ratio: 11 (ref 9–20)
BUN: 13 mg/dL (ref 6–24)
Bilirubin Total: 1.1 mg/dL (ref 0.0–1.2)
CO2: 26 mmol/L (ref 20–29)
Calcium: 9.7 mg/dL (ref 8.7–10.2)
Chloride: 103 mmol/L (ref 96–106)
Creatinine, Ser: 1.18 mg/dL (ref 0.76–1.27)
Globulin, Total: 2.5 g/dL (ref 1.5–4.5)
Glucose: 97 mg/dL (ref 70–99)
Potassium: 4.9 mmol/L (ref 3.5–5.2)
Sodium: 140 mmol/L (ref 134–144)
Total Protein: 7.4 g/dL (ref 6.0–8.5)
eGFR: 78 mL/min/{1.73_m2} (ref >59)

## 2021-10-14 LAB — CBC WITH DIFFERENTIAL/PLATELET
Basophils Absolute: 0 10*3/uL (ref 0.0–0.2)
Basos: 1 %
EOS (ABSOLUTE): 0.3 10*3/uL (ref 0.0–0.4)
Eos: 5 %
Hematocrit: 39.2 % (ref 37.5–51.0)
Hemoglobin: 14.6 g/dL (ref 13.0–17.7)
Immature Grans (Abs): 0 10*3/uL (ref 0.0–0.1)
Immature Granulocytes: 0 %
Lymphocytes Absolute: 1.3 10*3/uL (ref 0.7–3.1)
Lymphs: 22 %
MCH: 33.4 pg — ABNORMAL HIGH (ref 26.6–33.0)
MCHC: 37.2 g/dL — ABNORMAL HIGH (ref 31.5–35.7)
MCV: 90 fL (ref 79–97)
Monocytes Absolute: 0.8 10*3/uL (ref 0.1–0.9)
Monocytes: 14 %
Neutrophils Absolute: 3.5 10*3/uL (ref 1.4–7.0)
Neutrophils: 58 %
Platelets: 258 10*3/uL (ref 150–450)
RBC: 4.37 x10E6/uL (ref 4.14–5.80)
RDW: 12.2 % (ref 11.6–15.4)
WBC: 6 10*3/uL (ref 3.4–10.8)

## 2021-10-14 LAB — LIPID PANEL
Chol/HDL Ratio: 3.6 ratio (ref 0.0–5.0)
Cholesterol, Total: 214 mg/dL — ABNORMAL HIGH (ref 100–199)
HDL: 59 mg/dL (ref >39)
LDL Chol Calc (NIH): 146 mg/dL — ABNORMAL HIGH (ref 0–99)
Triglycerides: 51 mg/dL (ref 0–149)
VLDL Cholesterol Cal: 9 mg/dL (ref 5–40)

## 2021-11-17 ENCOUNTER — Encounter (INDEPENDENT_AMBULATORY_CARE_PROVIDER_SITE_OTHER): Payer: Self-pay

## 2021-11-17 ENCOUNTER — Encounter (INDEPENDENT_AMBULATORY_CARE_PROVIDER_SITE_OTHER): Payer: Self-pay | Admitting: Vascular Surgery

## 2021-12-29 ENCOUNTER — Ambulatory Visit (INDEPENDENT_AMBULATORY_CARE_PROVIDER_SITE_OTHER): Payer: 59 | Admitting: Vascular Surgery

## 2021-12-29 ENCOUNTER — Other Ambulatory Visit (INDEPENDENT_AMBULATORY_CARE_PROVIDER_SITE_OTHER): Payer: Self-pay | Admitting: Vascular Surgery

## 2021-12-29 ENCOUNTER — Other Ambulatory Visit (INDEPENDENT_AMBULATORY_CARE_PROVIDER_SITE_OTHER): Payer: Self-pay | Admitting: Nurse Practitioner

## 2021-12-29 ENCOUNTER — Encounter (INDEPENDENT_AMBULATORY_CARE_PROVIDER_SITE_OTHER): Payer: Self-pay | Admitting: Vascular Surgery

## 2021-12-29 ENCOUNTER — Ambulatory Visit (INDEPENDENT_AMBULATORY_CARE_PROVIDER_SITE_OTHER): Payer: 59

## 2021-12-29 VITALS — BP 125/76 | HR 81 | Resp 16 | Wt 205.6 lb

## 2021-12-29 DIAGNOSIS — Z8679 Personal history of other diseases of the circulatory system: Secondary | ICD-10-CM

## 2021-12-29 DIAGNOSIS — I779 Disorder of arteries and arterioles, unspecified: Secondary | ICD-10-CM

## 2021-12-29 DIAGNOSIS — G902 Horner's syndrome: Secondary | ICD-10-CM

## 2021-12-29 DIAGNOSIS — M542 Cervicalgia: Secondary | ICD-10-CM | POA: Diagnosis not present

## 2021-12-29 NOTE — Progress Notes (Signed)
Patient ID: Hunter Ball, male   DOB: 1975-11-01, 46 y.o.   MRN: [id number]  Chief Complaint  Patient presents with   New Patient (Initial Visit)    Ref Tami Ribas consult with carotid,possible tear in carotid    HPI Hunter Ball is a 46 y.o. male.  I am asked to see the patient by Dr. Tami Ribas for evaluation of carotid dissection.  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  Ultimately, his symptoms did improve over several weeks and he had an MR angiogram done a few months later which showed healed carotid artery without any obvious abnormalities.  He did well for several years.  Several weeks ago, he had an episode of intense retching and dry heaving.  Since that time, he has had pain in his left neck and noticeable clicking and a sensation when he turns his head to being able to feel an abnormality in his carotid artery.  He has not had focal neurologic symptoms on this episode.  He has not had any speech or swallowing issue.  No arm or leg weakness or numbness.  No visual symptoms on this episode.  We performed a carotid duplex today which showed relatively normal flow in both carotid arteries without significant atherosclerotic disease, no obvious flow limitations or dissections were seen by duplex.   Past Medical History:  Diagnosis Date   Exposure to mold 06/10/2021   Horner's syndrome     Past Surgical History:  Procedure Laterality Date   HAND SURGERY     NOSE SURGERY     orthopedic surgeries     RESECTION DISTAL CLAVICAL      Family History  Problem Relation Age of Onset   Valvular heart disease Father    Colon cancer Neg Hx    Prostate cancer Neg Hx    CAD Neg Hx    Diabetes Neg Hx    Hypertension Neg Hx    Thyroid disease Neg Hx       Social History   Tobacco Use   Smoking status: Never   Smokeless  tobacco: Never  Vaping Use   Vaping Use: Never used  Substance Use Topics   Alcohol use: Yes    Comment: occasional   Drug use: Not Currently    Types: Marijuana    Comment: Rarely     No Known Allergies  Current Outpatient Medications  Medication Sig Dispense Refill   Multiple Vitamin (MULTIVITAMIN ADULT) TABS Take 1 packet by mouth daily.     No current facility-administered medications for this visit.      REVIEW OF SYSTEMS (Negative unless checked)  Constitutional: _0 Weight loss  _1 Fever  _2 Chills Cardiac: _3 Chest pain   _4 Chest pressure   _5 Palpitations   _6 Shortness of breath when laying flat   _7 Shortness of breath at rest   _8 Shortness of breath with exertion. Vascular:  _9 Pain in legs with walking   _10 Pain in legs at rest   _11 Pain in legs when laying flat   _12 Claudication   _13 Pain in feet when walking  _14 Pain in feet at rest  _15 Pain in feet when laying flat   _16 History of DVT   _17 Phlebitis   _18 Swelling in legs   _19 Varicose veins   _20 Non-healing ulcers Pulmonary:   _21 Uses home oxygen   _22 Productive cough   _23 Hemoptysis   _24   Wheeze  _0 COPD   _1 Asthma Neurologic:  _2 Dizziness  _3 Blackouts   _4 Seizures   _5 History of stroke   _6 History of TIA  _7 Aphasia   _8 Temporary blindness   _9 Dysphagia   _10 Weakness or numbness in arms   _11 Weakness or numbness in legs Musculoskeletal:  _12 Arthritis   _13 Joint swelling   _14 Joint pain   _15 Low back pain X positive for neck discomfort Hematologic:  _16 Easy bruising  _17 Easy bleeding   _18 Hypercoagulable state   _19 Anemic  _20 Hepatitis Gastrointestinal:  _21 Blood in stool   _22 Vomiting blood  _23 Gastroesophageal reflux/heartburn   _24 Abdominal pain Genitourinary:  _25 Chronic kidney disease   _26 Difficult urination  _27 Frequent urination  _28 Burning with urination   _29 Hematuria Skin:  _30 Rashes   _31 Ulcers   _32 Wounds Psychological:  _33 History of anxiety   _34  History of major depression.    Physical Exam BP 125/76 (BP Location: Right Arm)   Pulse 81   Resp  16   Wt 205 lb 9.6 oz (93.3 kg)   BMI 23.16 kg/m  Gen:  WD/WN, NAD. Very tall and fit appearing Head: /AT, No temporalis wasting.  Ear/Nose/Throat: Hearing grossly intact, nares w/o erythema or drainage, oropharynx w/o Erythema/Exudate Eyes: Conjunctiva clear, sclera non-icteric  Neck: trachea midline.  No bruit or JVD.  His left carotid is palpable in the area of concern for his discomfort Pulmonary:  Good air movement, clear to auscultation bilaterally.  Cardiac: RRR, normal S1, S2 Vascular:  Vessel Right Left  Radial Palpable Palpable                   Musculoskeletal: M/S 5/5 throughout.  Extremities without ischemic changes.  No deformity or atrophy.  No edema. Neurologic: Sensation grossly intact in extremities.  Symmetrical.  Speech is fluent. Motor exam as listed above. Psychiatric: Judgment intact, Mood & affect appropriate for pt's clinical situation. Dermatologic: No rashes or ulcers noted.  No cellulitis or open wounds. Lymph : No Cervical, Axillary, or Inguinal lymphadenopathy.   Radiology VAS US CAROTID  Result Date: 12/30/2021 Carotid Arterial Duplex Study Patient Name:  Hunter Ball  Date of Exam:   12/29/2021 Medical Rec #: [id number]           Accession #:    [id number] Date of Birth: 12-Aug-1975           Patient Gender: M Patient Age:   10 years Exam Location:  [facility] Procedure:      VAS US CAROTID Referring Phys: Leotis Pain --------------------------------------------------------------------------------  Other Factors: Hx of Lt ICA dissection 2018. No intervention. Performing Technologist: Concha [place] RVT  Examination Guidelines: A complete evaluation includes B-mode imaging, spectral Doppler, color Doppler, and power Doppler as needed of all accessible portions of each vessel. Bilateral testing is considered an integral part of a complete examination. Limited examinations for reoccurring indications may be performed as noted.  Right Carotid  Findings: +----------+--------+--------+--------+------------------+--------+           PSV cm/sEDV cm/sStenosisPlaque DescriptionComments +----------+--------+--------+--------+------------------+--------+ CCA Prox  113     29                                         +----------+--------+--------+--------+------------------+--------+ CCA Mid   95      26                                         +----------+--------+--------+--------+------------------+--------+  CCA Distal73      24                                         +----------+--------+--------+--------+------------------+--------+ ICA Prox  49      23                                         +----------+--------+--------+--------+------------------+--------+ ICA Mid   75      35                                         +----------+--------+--------+--------+------------------+--------+ ICA Distal77      34                                         +----------+--------+--------+--------+------------------+--------+ ECA       83      17                                         +----------+--------+--------+--------+------------------+--------+ +----------+--------+-------+----------------+-------------------+           PSV cm/sEDV cmsDescribe        Arm Pressure (mmHG) +----------+--------+-------+----------------+-------------------+ [id number]             Multiphasic, WNL                    +----------+--------+-------+----------------+-------------------+ +---------+--------+--+--------+--+---------+ VertebralPSV cm/s42EDV cm/s14Antegrade +---------+--------+--+--------+--+---------+  Left Carotid Findings: +----------+--------+--------+--------+------------------+--------+           PSV cm/sEDV cm/sStenosisPlaque DescriptionComments +----------+--------+--------+--------+------------------+--------+ CCA Prox  111     27                                          +----------+--------+--------+--------+------------------+--------+ CCA Mid   87      22                                         +----------+--------+--------+--------+------------------+--------+ CCA Distal61      19                                         +----------+--------+--------+--------+------------------+--------+ ICA Prox  46      18                                         +----------+--------+--------+--------+------------------+--------+ ICA Mid   62      23                                         +----------+--------+--------+--------+------------------+--------+ ICA Distal71  27                                         +----------+--------+--------+--------+------------------+--------+ ECA       78      10                                         +----------+--------+--------+--------+------------------+--------+ +----------+--------+--------+----------------+-------------------+           PSV cm/sEDV cm/sDescribe        Arm Pressure (mmHG) +----------+--------+--------+----------------+-------------------+ [id number]              Multiphasic, WNL                    +----------+--------+--------+----------------+-------------------+ +---------+--------+--+--------+--+---------+ VertebralPSV cm/s44EDV cm/s15Antegrade +---------+--------+--+--------+--+---------+   Summary: Right Carotid: There was no evidence of thrombus, dissection, atherosclerotic                plaque or stenosis in the cervical carotid system. Left Carotid: There is no evidence of stenosis in the left ICA. Left               bulb/bifurcation is low and patient states he feels his symptoms               in this area. Vertebrals:  Bilateral vertebral arteries demonstrate antegrade flow. Subclavians: Normal flow hemodynamics were seen in bilateral subclavian              arteries. *See table(s) above for measurements and observations.  Electronically signed by Leotis Pain  MD on 12/30/2021 at 2:54:03 PM.    Final     Labs Recent Results (from the past 2160 hour(s))  Lipid panel     Status: Abnormal   Collection Time: 10/13/21  9:22 AM  Result Value Ref Range   Cholesterol, Total 214 (H) 100 - 199 mg/dL   Triglycerides 51 0 - 149 mg/dL   HDL 59 >39 mg/dL   VLDL Cholesterol Cal 9 5 - 40 mg/dL   LDL Chol Calc (NIH) 146 (H) 0 - 99 mg/dL   Chol/HDL Ratio 3.6 0.0 - 5.0 ratio    Comment:                                   T. Chol/HDL Ratio                                             Men  Women                               1/2 Avg.Risk  3.4    3.3                                   Avg.Risk  5.0    4.4                                2X Avg.Risk  9.6  7.1                                3X Avg.Risk 23.4   11.0   Comprehensive metabolic panel     Status: None   Collection Time: 10/13/21  9:22 AM  Result Value Ref Range   Glucose 97 70 - 99 mg/dL   BUN 13 6 - 24 mg/dL   Creatinine, Ser 1.18 0.76 - 1.27 mg/dL   eGFR 78 >59 mL/min/1.73   BUN/Creatinine Ratio 11 9 - 20   Sodium 140 134 - 144 mmol/L   Potassium 4.9 3.5 - 5.2 mmol/L   Chloride 103 96 - 106 mmol/L   CO2 26 20 - 29 mmol/L   Calcium 9.7 8.7 - 10.2 mg/dL   Total Protein 7.4 6.0 - 8.5 g/dL   Albumin 4.9 4.0 - 5.0 g/dL   Globulin, Total 2.5 1.5 - 4.5 g/dL   Albumin/Globulin Ratio 2.0 1.2 - 2.2   Bilirubin Total 1.1 0.0 - 1.2 mg/dL   Alkaline Phosphatase 64 44 - 121 IU/L   AST 23 0 - 40 IU/L   ALT 17 0 - 44 IU/L  CBC with Differential/Platelet     Status: Abnormal   Collection Time: 10/13/21  9:22 AM  Result Value Ref Range   WBC 6.0 3.4 - 10.8 x10E3/uL   RBC 4.37 4.14 - 5.80 x10E6/uL    Comment: Red blood cells appear slightly agglutinated The RBC, HCT, and red cell indices may be inaccurate due to RBC agglutination.    Hemoglobin 14.6 13.0 - 17.7 g/dL   Hematocrit 39.2 37.5 - 51.0 %   MCV 90 79 - 97 fL   MCH 33.4 (H) 26.6 - 33.0 pg   MCHC 37.2 (H) 31.5 - 35.7 g/dL   RDW 12.2 11.6 -  15.4 %   Platelets 258 150 - 450 x10E3/uL   Neutrophils 58 Not Estab. %   Lymphs 22 Not Estab. %   Monocytes 14 Not Estab. %   Eos 5 Not Estab. %   Basos 1 Not Estab. %   Neutrophils Absolute 3.5 1.4 - 7.0 x10E3/uL   Lymphocytes Absolute 1.3 0.7 - 3.1 x10E3/uL   Monocytes Absolute 0.8 0.1 - 0.9 x10E3/uL   EOS (ABSOLUTE) 0.3 0.0 - 0.4 x10E3/uL   Basophils Absolute 0.0 0.0 - 0.2 x10E3/uL   Immature Granulocytes 0 Not Estab. %   Immature Grans (Abs) 0.0 0.0 - 0.1 x10E3/uL   Hematology Comments: Note:     Comment: Verified by microscopic examination.    Assessment/Plan:  Neck pain Carotid duplex today reveals no evidence of significant stenosis in either carotid artery and no significant plaque formation.  There is no current flow abnormality or dissection and is likely his previous dissection has healed.  We cannot interrogate the most distal internal carotid to the more proximal common carotid artery well by duplex, and given his continued symptoms and his clear history of what sounds like a relatively classic carotid dissection 5 years ago with a chance of pseudoaneurysm or other changes in the areas that cannot be seen by duplex, I would recommend a CT angiogram for further evaluation.  I will order that and see the patient back following the study to discuss the results and determine further treatment options if any. I have asked him to start taking an aspirin a day.   Left-sided carotid artery disease ([place]) The patient describes a history classic  with a carotid dissection 5 years ago which appeared to heal at that time.  He is still having pain and symptoms in the left neck. Carotid duplex today reveals no evidence of significant stenosis in either carotid artery and no significant plaque formation.  There is no current flow abnormality or dissection and is likely his previous dissection has healed.  Horner's syndrome Five years ago. As above      Leotis Pain 12/30/2021, 5:27  PM   This note was created with Dragon medical transcription system.  Any errors from dictation are unintentional.

## 2021-12-30 DIAGNOSIS — G902 Horner's syndrome: Secondary | ICD-10-CM | POA: Insufficient documentation

## 2021-12-30 NOTE — Assessment & Plan Note (Signed)
The patient describes a history classic with a carotid dissection 5 years ago which appeared to heal at that time.  He is still having pain and symptoms in the left neck. Carotid duplex today reveals no evidence of significant stenosis in either carotid artery and no significant plaque formation.  There is no current flow abnormality or dissection and is likely his previous dissection has healed.

## 2021-12-30 NOTE — Assessment & Plan Note (Signed)
Five years ago. As above

## 2021-12-30 NOTE — Assessment & Plan Note (Addendum)
Carotid duplex today reveals no evidence of significant stenosis in either carotid artery and no significant plaque formation.  There is no current flow abnormality or dissection and is likely his previous dissection has healed.  We cannot interrogate the most distal internal carotid to the more proximal common carotid artery well by duplex, and given his continued symptoms and his clear history of what sounds like a relatively classic carotid dissection 5 years ago with a chance of pseudoaneurysm or other changes in the areas that cannot be seen by duplex, I would recommend a CT angiogram for further evaluation.  I will order that and see the patient back following the study to discuss the results and determine further treatment options if any. I have asked him to start taking an aspirin a day.

## 2022-01-13 ENCOUNTER — Other Ambulatory Visit: Payer: 59

## 2022-01-13 ENCOUNTER — Ambulatory Visit
Admission: RE | Admit: 2022-01-13 | Discharge: 2022-01-13 | Disposition: A | Discharge status: Home / Self Care | Payer: 59 | Source: Ambulatory Visit | Attending: Vascular Surgery | Admitting: Vascular Surgery

## 2022-01-13 DIAGNOSIS — M542 Cervicalgia: Secondary | ICD-10-CM | POA: Diagnosis present

## 2022-01-13 DIAGNOSIS — I779 Disorder of arteries and arterioles, unspecified: Secondary | ICD-10-CM | POA: Diagnosis present

## 2022-01-13 MED ORDER — IOHEXOL 350 MG/ML SOLN
100.0000 mL | Freq: Once | INTRAVENOUS | Status: AC | PRN
  Administered 2022-01-13: 100 mL via INTRAVENOUS

## 2022-01-28 ENCOUNTER — Encounter (INDEPENDENT_AMBULATORY_CARE_PROVIDER_SITE_OTHER): Payer: Self-pay | Admitting: *Deleted

## 2022-02-23 ENCOUNTER — Telehealth (INDEPENDENT_AMBULATORY_CARE_PROVIDER_SITE_OTHER): Payer: Self-pay | Admitting: Vascular Surgery

## 2022-02-23 NOTE — Telephone Encounter (Signed)
Patient called in stating that he had his CTA done and is wanting to talk to Dr. [name] prior to coming in or not wanting to wait until appt time and day. Patient states he identified and found out himself that its a bone scratching his carotid and would like to speak with provider about this ASAP. Says it wont take longer than 5 minutes to have this conversation.     Please call and advise

## 2022-02-23 NOTE — Telephone Encounter (Signed)
Patient appointment can be moved up accordingly to schedule availability

## 2022-03-02 ENCOUNTER — Ambulatory Visit (INDEPENDENT_AMBULATORY_CARE_PROVIDER_SITE_OTHER): Payer: 59 | Admitting: Vascular Surgery

## 2022-03-02 ENCOUNTER — Encounter (INDEPENDENT_AMBULATORY_CARE_PROVIDER_SITE_OTHER): Payer: Self-pay | Admitting: Vascular Surgery

## 2022-03-02 VITALS — BP 102/55 | HR 69 | Resp 16 | Wt 208.0 lb

## 2022-03-02 DIAGNOSIS — I779 Disorder of arteries and arterioles, unspecified: Secondary | ICD-10-CM | POA: Diagnosis not present

## 2022-03-02 DIAGNOSIS — Q8740 Marfan's syndrome, unspecified: Secondary | ICD-10-CM

## 2022-03-02 DIAGNOSIS — M542 Cervicalgia: Secondary | ICD-10-CM

## 2022-03-02 NOTE — Assessment & Plan Note (Signed)
History of a dissection about 5 years ago.  This has healed and there were no obvious abnormalities on the CT scan at this time.  I think an annual follow-up with duplex is reasonable.

## 2022-03-02 NOTE — Progress Notes (Signed)
MRN : [id number]  Hunter Ball is a 46 y.o. (1976/06/02) male who presents with chief complaint of  Chief Complaint  Patient presents with   Follow-up    Ct results  .  History of Present Illness: Patient returns today in follow up after his CT scans which I have independently reviewed.  He has had no focal neurologic symptoms since his last visit.  He is still troubled by a clicking sensation in his throat and he says he is now having more issues with swallowing.  I have independently reviewed his CT angiogram of both the chest and of the neck.  His chest CT shows no abnormality with normal caliber arteries without dissection or aneurysmal degeneration.  His carotid CT shows normal arteries.  He has a previous history of dissection but this appears to have healed without any obvious abnormality seen on his CT scan today.  The area that he pinpoints and points to feels like his hyoid bone just medial to the carotid artery on the left.  Current Outpatient Medications  Medication Sig Dispense Refill   Multiple Vitamin (MULTIVITAMIN ADULT) TABS Take 1 packet by mouth daily.     No current facility-administered medications for this visit.    Past Medical History:  Diagnosis Date   Exposure to mold 06/10/2021   Horner's syndrome     Past Surgical History:  Procedure Laterality Date   HAND SURGERY     NOSE SURGERY     orthopedic surgeries     RESECTION DISTAL CLAVICAL       Social History   Tobacco Use   Smoking status: Never   Smokeless tobacco: Never  Vaping Use   Vaping Use: Never used  Substance Use Topics   Alcohol use: Yes    Comment: occasional   Drug use: Not Currently    Types: Marijuana    Comment: Rarely      Family History  Problem Relation Age of Onset   Valvular heart disease Father    Colon cancer Neg Hx    Prostate cancer Neg Hx    CAD Neg Hx    Diabetes Neg Hx    Hypertension Neg Hx    Thyroid disease Neg Hx      No Known  Allergies   REVIEW OF SYSTEMS (Negative unless checked)  Constitutional: [] Weight loss  [] Fever  [] Chills Cardiac: [] Chest pain   [] Chest pressure   [] Palpitations   [] Shortness of breath when laying flat   [] Shortness of breath at rest   [] Shortness of breath with exertion. Vascular:  [] Pain in legs with walking   [] Pain in legs at rest   [] Pain in legs when laying flat   [] Claudication   [] Pain in feet when walking  [] Pain in feet at rest  [] Pain in feet when laying flat   [] History of DVT   [] Phlebitis   [] Swelling in legs   [] Varicose veins   [] Non-healing ulcers Pulmonary:   [] Uses home oxygen   [] Productive cough   [] Hemoptysis   [] Wheeze  [] COPD   [] Asthma Neurologic:  [] Dizziness  [] Blackouts   [] Seizures   [] History of stroke   [] History of TIA  [] Aphasia   [] Temporary blindness   [x] Dysphagia   [] Weakness or numbness in arms   [] Weakness or numbness in legs Musculoskeletal:  [] Arthritis   [] Joint swelling   [] Joint pain   [] Low back pain X positive for neck discomfort Hematologic:  [] Easy bruising  [] Easy bleeding   [] Hypercoagulable  state   [] Anemic   Gastrointestinal:  [] Blood in stool   [] Vomiting blood  [] Gastroesophageal reflux/heartburn   [] Abdominal pain Genitourinary:  [] Chronic kidney disease   [] Difficult urination  [] Frequent urination  [] Burning with urination   [] Hematuria Skin:  [] Rashes   [] Ulcers   [] Wounds Psychological:  [] History of anxiety   []  History of major depression.  Physical Examination  BP (!) 102/55 (BP Location: Right Arm)   Pulse 69   Resp 16   Wt 208 lb (94.3 kg)   BMI 23.43 kg/m  Gen:  WD/WN, NAD. Tall and thin Head: [place]/AT, No temporalis wasting. Ear/Nose/Throat: Hearing grossly intact, nares w/o erythema or drainage Eyes: Conjunctiva clear. Sclera non-icteric Neck: Supple.  Trachea midline.  He pinpoints the area in question that feels like the hyoid bone just medial to his carotid artery. Pulmonary:  Good air movement, no use of accessory  muscles.  Cardiac: RRR, no JVD Vascular:  Vessel Right Left  Radial Palpable Palpable                       Musculoskeletal: M/S 5/5 throughout.  No deformity or atrophy. No edema. Neurologic: Sensation grossly intact in extremities.  Symmetrical.  Speech is fluent.  Psychiatric: Judgment intact, Mood & affect appropriate for pt's clinical situation. Dermatologic: No rashes or ulcers noted.  No cellulitis or open wounds.      Labs No results found for this or any previous visit (from the past 2160 hour(s)).  Radiology No results found.  Assessment/Plan  Neck pain His chest CT shows no abnormality with normal caliber arteries without dissection or aneurysmal degeneration.  His carotid CT shows normal arteries.  He has a previous history of dissection but this appears to have healed without any obvious abnormality seen on his CT scan today.  I do not have a good vascular cause of his pain.  He apparently had a healed dissection from 5 years or so ago but this looks completely healed on CT and there is no role for any mention.  I would plan an annual carotid duplex.  He has seen Dr. in the past and may be he can be of some help to discern whether or not that is the hyoid bone and if anything can be done for that in terms of his neck pain and swallowing discomfort.  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  This has healed and there were no obvious abnormalities on the CT scan at this time.  I think an annual follow-up with duplex is reasonable.  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    , MD  03/02/2022 3:58 PM    This note was created with Dragon medical transcription system.  Any errors from dictation are purely unintentional

## 2022-03-02 NOTE — Assessment & Plan Note (Addendum)
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  Those are not problematic or consequential.

## 2022-03-02 NOTE — Assessment & Plan Note (Signed)
His chest CT shows no abnormality with normal caliber arteries without dissection or aneurysmal degeneration.  His carotid CT shows normal arteries.  He has a previous history of dissection but this appears to have healed without any obvious abnormality seen on his CT scan today.  I do not have a good vascular cause of his pain.  He apparently had a healed dissection from 5 years or so ago but this looks completely healed on CT and there is no role for any mention.  I would plan an annual carotid duplex.  He has seen Dr. Jenne Campus in the past and may be he can be of some help to discern whether or not that is the hyoid bone and if anything can be done for that in terms of his neck pain and swallowing discomfort.

## 2022-03-09 ENCOUNTER — Ambulatory Visit (INDEPENDENT_AMBULATORY_CARE_PROVIDER_SITE_OTHER): Payer: 59 | Admitting: Vascular Surgery

## 2022-05-03 ENCOUNTER — Encounter (INDEPENDENT_AMBULATORY_CARE_PROVIDER_SITE_OTHER): Payer: Self-pay

## 2022-05-05 ENCOUNTER — Ambulatory Visit (HOSPITAL_BASED_OUTPATIENT_CLINIC_OR_DEPARTMENT_OTHER): Payer: 59 | Admitting: Family Medicine

## 2022-05-06 ENCOUNTER — Ambulatory Visit (INDEPENDENT_AMBULATORY_CARE_PROVIDER_SITE_OTHER): Payer: 59

## 2022-05-06 ENCOUNTER — Ambulatory Visit (INDEPENDENT_AMBULATORY_CARE_PROVIDER_SITE_OTHER): Payer: 59 | Admitting: Family Medicine

## 2022-05-06 ENCOUNTER — Encounter (HOSPITAL_BASED_OUTPATIENT_CLINIC_OR_DEPARTMENT_OTHER): Payer: Self-pay | Admitting: Family Medicine

## 2022-05-06 VITALS — BP 110/71 | HR 86 | Ht 79.0 in | Wt 197.0 lb

## 2022-05-06 DIAGNOSIS — R051 Acute cough: Secondary | ICD-10-CM

## 2022-05-06 MED ORDER — BENZONATATE 200 MG PO CAPS: 200.0000 mg | ORAL_CAPSULE | Freq: Three times a day (TID) | ORAL | 0 refills | Status: AC | PRN

## 2022-05-06 NOTE — Assessment & Plan Note (Signed)
Patient reports that he initially got sick about 1 month ago.  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  He did complete COVID test at that time which reportedly was negative.  Early on, he had symptoms of cough, fatigue, sweats, fevers, sinus congestion.  Generally, symptoms have improved, however he continues to have cough, occasional wheezing.  Cough has been more productive recently, was dry previously.  He has been utilizing over-the-counter medications, recently started utilizing Robitussin-DM.  He is concerned given duration of symptoms and presents for evaluation today. On exam, patient is in no acute distress, vital signs stable, patient is afebrile.  He does have intermittent coughing during exam.  Cardiovascular exam with regular rate and rhythm.  Lungs with scattered, intermittent wheezing, crackles. Discussed with patient that postviral cough can persist up to 4 to 6 weeks after onset of initial illness and thus current symptoms could simply be related to this.  However given pulmonary findings, feel it would be reasonable to proceed with x-ray today.  Additionally, could be related to underlying bronchitis and discussed that this typically is also related to viral etiology. Can you with conservative measures to help with controlling symptoms and cough.  We will send prescription to pharmacy for Tessalon Perles to assist with controlling cough If abnormality observed on chest x-ray, we will manage accordingly

## 2022-05-06 NOTE — Patient Instructions (Signed)
  Medication Instructions:  Your physician recommends that you continue on your current medications as directed. Please refer to the Current Medication list given to you today. --If you need a refill on any your medications before your next appointment, please call your pharmacy first. If no refills are authorized on file call the office.-- Lab Work: Your physician has recommended that you have lab work today: No If you have labs (blood work) drawn today and your tests are completely normal, you will receive your results via MyChart message OR a phone call from our staff.  Please ensure you check your voicemail in the event that you authorized detailed messages to be left on a delegated number. If you have any lab test that is abnormal or we need to change your treatment, we will call you to review the results.  Referrals/Procedures/Imaging: Yes, Chest X-ray  Follow-Up: Your next appointment:   Your physician recommends that you schedule a follow-up appointment as needed with Dr. de [place].  You will receive a text message or e-mail with a link to a survey about your care and experience with us today! We would greatly appreciate your feedback!   Thanks for letting us be apart of your health journey!!  Primary Care and Sports Medicine   Dr. Raymond de Cuba   We encourage you to activate your patient portal called "MyChart".  Sign up information is provided on this After Visit Summary.  MyChart is used to connect with patients for Virtual Visits (Telemedicine).  Patients are able to view lab/test results, encounter notes, upcoming appointments, etc.  Non-urgent messages can be sent to your provider as well. To learn more about what you can do with MyChart, please visit --  [email or web address].    

## 2022-05-06 NOTE — Progress Notes (Signed)
    Procedures performed today:    None.  Independent interpretation of notes and tests performed by another provider:   None.  Brief History, Exam, Impression, and Recommendations:    BP 110/71   Pulse 86   Ht 6\' 7"  (2.007 m)   Wt 197 lb (89.4 kg)   SpO2 95%   BMI 22.19 kg/m   Acute cough Patient reports that he initially got sick about 1 month ago.  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  He did complete COVID test at that time which reportedly was negative.  Early on, he had symptoms of cough, fatigue, sweats, fevers, sinus congestion.  Generally, symptoms have improved, however he continues to have cough, occasional wheezing.  Cough has been more productive recently, was dry previously.  He has been utilizing over-the-counter medications, recently started utilizing Robitussin-DM.  He is concerned given duration of symptoms and presents for evaluation today. On exam, patient is in no acute distress, vital signs stable, patient is afebrile.  He does have intermittent coughing during exam.  Cardiovascular exam with regular rate and rhythm.  Lungs with scattered, intermittent wheezing, crackles. Discussed with patient that postviral cough can persist up to 4 to 6 weeks after onset of initial illness and thus current symptoms could simply be related to this.  However given pulmonary findings, feel it would be reasonable to proceed with x-ray today.  Additionally, could be related to underlying bronchitis and discussed that this typically is also related to viral etiology. Can you with conservative measures to help with controlling symptoms and cough.  We will send prescription to pharmacy for Tessalon Perles to assist with controlling cough If abnormality observed on chest x-ray, we will manage accordingly  Return if symptoms worsen or fail to improve.   ___________________________________________ Quintella Mura de [place], MD, ABFM,  [facility] [place]

## 2022-06-02 ENCOUNTER — Encounter (HOSPITAL_BASED_OUTPATIENT_CLINIC_OR_DEPARTMENT_OTHER): Payer: Self-pay

## 2022-08-13 ENCOUNTER — Telehealth (HOSPITAL_BASED_OUTPATIENT_CLINIC_OR_DEPARTMENT_OTHER): Payer: Self-pay | Admitting: Family Medicine

## 2022-08-13 NOTE — Telephone Encounter (Signed)
PT advised NO to the FLU Vaccine

## 2022-09-15 ENCOUNTER — Encounter (HOSPITAL_BASED_OUTPATIENT_CLINIC_OR_DEPARTMENT_OTHER): Payer: Self-pay

## 2023-01-18 ENCOUNTER — Other Ambulatory Visit (HOSPITAL_BASED_OUTPATIENT_CLINIC_OR_DEPARTMENT_OTHER): Payer: Self-pay | Admitting: *Deleted

## 2023-01-18 ENCOUNTER — Telehealth (HOSPITAL_BASED_OUTPATIENT_CLINIC_OR_DEPARTMENT_OTHER): Payer: Self-pay | Admitting: *Deleted

## 2023-01-18 DIAGNOSIS — Z1211 Encounter for screening for malignant neoplasm of colon: Secondary | ICD-10-CM

## 2023-01-18 NOTE — Telephone Encounter (Signed)
Called to offer colon cancer screening. Pt advised of options of cologuard and colonoscopy. Pt prefers to do cologuard at this time. Cologuard kit ordered. Pt has no transportation issues at this time.

## 2023-03-01 ENCOUNTER — Encounter (INDEPENDENT_AMBULATORY_CARE_PROVIDER_SITE_OTHER): Payer: 59

## 2023-03-01 ENCOUNTER — Ambulatory Visit (INDEPENDENT_AMBULATORY_CARE_PROVIDER_SITE_OTHER): Payer: 59 | Admitting: Vascular Surgery

## 2023-04-18 ENCOUNTER — Other Ambulatory Visit (INDEPENDENT_AMBULATORY_CARE_PROVIDER_SITE_OTHER): Payer: Self-pay | Admitting: Vascular Surgery

## 2023-04-18 DIAGNOSIS — I6523 Occlusion and stenosis of bilateral carotid arteries: Secondary | ICD-10-CM

## 2023-04-22 ENCOUNTER — Ambulatory Visit (INDEPENDENT_AMBULATORY_CARE_PROVIDER_SITE_OTHER): Payer: 59 | Admitting: Vascular Surgery

## 2023-04-22 ENCOUNTER — Encounter (INDEPENDENT_AMBULATORY_CARE_PROVIDER_SITE_OTHER): Payer: 59

## 2023-11-22 ENCOUNTER — Encounter (INDEPENDENT_AMBULATORY_CARE_PROVIDER_SITE_OTHER): Payer: Self-pay
